# Patient Record
Sex: Female | Born: 1960 | Race: White | Hispanic: No | Smoking: Current every day smoker
Health system: Southern US, Community
[De-identification: ages and names within clinical notes are randomized; demographics above are authoritative.]

## PROBLEM LIST (undated history)

## (undated) DIAGNOSIS — F419 Anxiety disorder, unspecified: Secondary | ICD-10-CM

## (undated) DIAGNOSIS — J381 Polyp of vocal cord and larynx: Secondary | ICD-10-CM

## (undated) DIAGNOSIS — E785 Hyperlipidemia, unspecified: Secondary | ICD-10-CM

## (undated) HISTORY — DX: Polyp of vocal cord and larynx: J38.1

## (undated) HISTORY — DX: Anxiety disorder, unspecified: F41.9

## (undated) HISTORY — DX: Hyperlipidemia, unspecified: E78.5

## (undated) HISTORY — PX: TUBAL LIGATION: SHX77

---

## 2008-03-07 ENCOUNTER — Emergency Department (HOSPITAL_COMMUNITY): Admission: EM | Admit: 2008-03-07 | Discharge: 2008-03-08 | Payer: Self-pay | Admitting: Emergency Medicine

## 2008-03-11 ENCOUNTER — Emergency Department (HOSPITAL_COMMUNITY): Admission: EM | Admit: 2008-03-11 | Discharge: 2008-03-11 | Payer: Self-pay | Admitting: Emergency Medicine

## 2010-09-11 ENCOUNTER — Emergency Department (HOSPITAL_COMMUNITY): Admission: EM | Admit: 2010-09-11 | Discharge: 2010-09-11 | Payer: Self-pay | Admitting: Emergency Medicine

## 2011-03-13 LAB — URINALYSIS, ROUTINE W REFLEX MICROSCOPIC
Glucose, UA: NEGATIVE mg/dL
Nitrite: NEGATIVE
Urobilinogen, UA: 0.2 mg/dL (ref 0.0–1.0)
Urobilinogen, UA: 0.2 mg/dL (ref 0.0–1.0)
pH: 5 (ref 5.0–8.0)

## 2011-03-13 LAB — COMPREHENSIVE METABOLIC PANEL
ALT: 12 U/L (ref 0–35)
AST: 16 U/L (ref 0–37)
Alkaline Phosphatase: 45 U/L (ref 39–117)
BUN: 10 mg/dL (ref 6–23)
Calcium: 9.8 mg/dL (ref 8.4–10.5)
GFR calc non Af Amer: >60 mL/min (ref >60)
Potassium: 4 mEq/L (ref 3.5–5.1)
Total Bilirubin: 0.8 mg/dL (ref 0.3–1.2)
Total Protein: 7.4 g/dL (ref 6.0–8.3)

## 2011-03-13 LAB — DIFFERENTIAL
Eosinophils Relative: 1 % (ref 0–5)
Lymphocytes Relative: 35 % (ref 12–46)
Lymphs Abs: 2.9 10*3/uL (ref 0.7–4.0)
Neutrophils Relative %: 56 % (ref 43–77)

## 2011-03-13 LAB — LACTIC ACID, PLASMA: Lactic Acid, Venous: 1.2 mmol/L (ref 0.5–2.2)

## 2011-03-13 LAB — APTT: aPTT: 33 seconds (ref 24–37)

## 2011-03-13 LAB — CBC
HCT: 43.8 % (ref 36.0–46.0)
Hemoglobin: 15 g/dL (ref 12.0–15.0)
RDW: 14.4 % (ref 11.5–15.5)

## 2011-03-13 LAB — LIPASE, BLOOD: Lipase: 26 U/L (ref 11–59)

## 2011-03-13 LAB — PROTIME-INR
INR: 0.95 (ref 0.00–1.49)
Prothrombin Time: 12.9 seconds (ref 11.6–15.2)

## 2012-03-11 ENCOUNTER — Ambulatory Visit (INDEPENDENT_AMBULATORY_CARE_PROVIDER_SITE_OTHER): Payer: Self-pay | Admitting: Otolaryngology

## 2012-03-11 DIAGNOSIS — K219 Gastro-esophageal reflux disease without esophagitis: Secondary | ICD-10-CM

## 2012-03-11 DIAGNOSIS — J383 Other diseases of vocal cords: Secondary | ICD-10-CM

## 2012-03-11 DIAGNOSIS — R490 Dysphonia: Secondary | ICD-10-CM

## 2012-06-10 ENCOUNTER — Ambulatory Visit (INDEPENDENT_AMBULATORY_CARE_PROVIDER_SITE_OTHER): Payer: Self-pay | Admitting: Otolaryngology

## 2014-10-13 ENCOUNTER — Emergency Department (HOSPITAL_COMMUNITY)
Admission: EM | Admit: 2014-10-13 | Discharge: 2014-10-14 | Disposition: A | Discharge status: Home / Self Care | Payer: Self-pay | Attending: Emergency Medicine | Admitting: Emergency Medicine

## 2014-10-13 ENCOUNTER — Encounter (HOSPITAL_COMMUNITY): Payer: Self-pay | Admitting: Emergency Medicine

## 2014-10-13 DIAGNOSIS — S63501A Unspecified sprain of right wrist, initial encounter: Secondary | ICD-10-CM | POA: Insufficient documentation

## 2014-10-13 DIAGNOSIS — S0083XA Contusion of other part of head, initial encounter: Secondary | ICD-10-CM | POA: Insufficient documentation

## 2014-10-13 DIAGNOSIS — Z720 Tobacco use: Secondary | ICD-10-CM | POA: Insufficient documentation
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
## 2014-10-14 ENCOUNTER — Emergency Department (HOSPITAL_COMMUNITY): Payer: Self-pay

## 2014-10-14 MED ORDER — ACETAMINOPHEN 325 MG PO TABS
650.0000 mg | ORAL_TABLET | Freq: Once | ORAL | Status: AC
  Administered 2014-10-14: 650 mg via ORAL
  Filled 2014-10-14: qty 2

## 2014-10-14 MED ORDER — IBUPROFEN 800 MG PO TABS
800.0000 mg | ORAL_TABLET | Freq: Once | ORAL | Status: AC
  Administered 2014-10-14: 800 mg via ORAL
  Filled 2014-10-14: qty 1

## 2014-10-14 NOTE — Discharge Instructions (Signed)
Wear splint as needed. Take ibuprofen, acetaminophen, or naproxen as needed for pain.   Contusion A contusion is a deep bruise. Contusions are the result of an injury that caused bleeding under the skin. The contusion may turn blue, purple, or yellow. Minor injuries will give you a painless contusion, but more severe contusions may stay painful and swollen for a few weeks.  CAUSES  A contusion is usually caused by a blow, trauma, or direct force to an area of the body. SYMPTOMS   Swelling and redness of the injured area.  Bruising of the injured area.  Tenderness and soreness of the injured area.  Pain. DIAGNOSIS  The diagnosis can be made by taking a history and physical exam. An X-ray, CT scan, or MRI may be needed to determine if there were any associated injuries, such as fractures. TREATMENT  Specific treatment will depend on what area of the body was injured. In general, the best treatment for a contusion is resting, icing, elevating, and applying cold compresses to the injured area. Over-the-counter medicines may also be recommended for pain control. Ask your caregiver what the best treatment is for your contusion. HOME CARE INSTRUCTIONS   Put ice on the injured area.  Put ice in a plastic bag.  Place a towel between your skin and the bag.  Leave the ice on for 15-20 minutes, 3-4 times a day, or as directed by your health care provider.  Only take over-the-counter or prescription medicines for pain, discomfort, or fever as directed by your caregiver. Your caregiver may recommend avoiding anti-inflammatory medicines (aspirin, ibuprofen, and naproxen) for 48 hours because these medicines may increase bruising.  Rest the injured area.  If possible, elevate the injured area to reduce swelling. SEEK IMMEDIATE MEDICAL CARE IF:   You have increased bruising or swelling.  You have pain that is getting worse.  Your swelling or pain is not relieved with medicines. MAKE SURE  YOU:   Understand these instructions.  Will watch your condition.  Will get help right away if you are not doing well or get worse. Document Released: 09/24/2005 Document Revised: 12/20/2013 Document Reviewed: 10/20/2011 [facility] Patient Information 2015 [place], [place]. This information is not intended to replace advice given to you by your health care provider. Make sure you discuss any questions you have with your health care provider.  Wrist Pain Wrist injuries are frequent in adults and children. A sprain is an injury to the ligaments that hold your bones together. A strain is an injury to muscle or muscle cord-like structures (tendons) from stretching or pulling. Generally, when wrists are moderately tender to touch following a fall or injury, a break in the bone (fracture) may be present. Most wrist sprains or strains are better in 3 to 5 days, but complete healing may take several weeks. HOME CARE INSTRUCTIONS   Put ice on the injured area.  Put ice in a plastic bag.  Place a towel between your skin and the bag.  Leave the ice on for 15-20 minutes, 3-4 times a day, for the first 2 days, or as directed by your health care provider.  Keep your arm raised above the level of your heart whenever possible to reduce swelling and pain.  Rest the injured area for at least 48 hours or as directed by your health care provider.  If a splint or elastic bandage has been applied, use it for as long as directed by your health care provider or until seen by a  health care provider for a follow-up exam.  Only take over-the-counter or prescription medicines for pain, discomfort, or fever as directed by your health care provider.  Keep all follow-up appointments. You may need to follow up with a specialist or have follow-up X-rays. Improvement in pain level is not a guarantee that you did not fracture a bone in your wrist. The only way to determine whether or not you have a broken bone is by  X-ray. SEEK IMMEDIATE MEDICAL CARE IF:   Your fingers are swollen, very red, white, or cold and blue.  Your fingers are numb or tingling.  You have increasing pain.  You have difficulty moving your fingers. MAKE SURE YOU:   Understand these instructions.  Will watch your condition.  Will get help right away if you are not doing well or get worse. Document Released: 09/24/2005 Document Revised: 12/20/2013 Document Reviewed: 02/05/2011 [facility] Patient Information 2015 [place], [place]. This information is not intended to replace advice given to you by your health care provider. Make sure you discuss any questions you have with your health care provider.

## 2015-08-05 ENCOUNTER — Emergency Department (HOSPITAL_COMMUNITY)
Admission: EM | Admit: 2015-08-05 | Discharge: 2015-08-06 | Disposition: A | Discharge status: Home / Self Care | Payer: Self-pay | Attending: Emergency Medicine | Admitting: Emergency Medicine

## 2015-08-05 ENCOUNTER — Encounter (HOSPITAL_COMMUNITY): Payer: Self-pay | Admitting: Emergency Medicine

## 2015-08-05 ENCOUNTER — Emergency Department (HOSPITAL_COMMUNITY): Payer: Self-pay

## 2015-08-05 DIAGNOSIS — Z720 Tobacco use: Secondary | ICD-10-CM | POA: Insufficient documentation

## 2015-08-05 DIAGNOSIS — R410 Disorientation, unspecified: Secondary | ICD-10-CM | POA: Insufficient documentation

## 2015-08-05 DIAGNOSIS — E041 Nontoxic single thyroid nodule: Secondary | ICD-10-CM

## 2015-08-05 DIAGNOSIS — R202 Paresthesia of skin: Secondary | ICD-10-CM

## 2015-08-05 DIAGNOSIS — R200 Anesthesia of skin: Secondary | ICD-10-CM | POA: Insufficient documentation

## 2015-08-05 LAB — CBC WITH DIFFERENTIAL/PLATELET
Basophils Absolute: 0 10*3/uL (ref 0.0–0.1)
Basophils Relative: 0 % (ref 0–1)
Eosinophils Absolute: 0.1 10*3/uL (ref 0.0–0.7)
Eosinophils Relative: 1 % (ref 0–5)
HEMATOCRIT: 40.7 % (ref 36.0–46.0)
Hemoglobin: 13.7 g/dL (ref 12.0–15.0)
LYMPHS PCT: 39 % (ref 12–46)
Lymphs Abs: 2.8 10*3/uL (ref 0.7–4.0)
MCH: 34.9 pg — ABNORMAL HIGH (ref 26.0–34.0)
MCHC: 33.7 g/dL (ref 30.0–36.0)
MCV: 103.6 fL — AB (ref 78.0–100.0)
MONOS PCT: 7 % (ref 3–12)
Monocytes Absolute: 0.5 10*3/uL (ref 0.1–1.0)
NEUTROS ABS: 3.8 10*3/uL (ref 1.7–7.7)
NEUTROS PCT: 53 % (ref 43–77)
PLATELETS: 244 10*3/uL (ref 150–400)
RBC: 3.93 MIL/uL (ref 3.87–5.11)
RDW: 12.7 % (ref 11.5–15.5)
WBC: 7.3 10*3/uL (ref 4.0–10.5)

## 2015-08-05 LAB — COMPREHENSIVE METABOLIC PANEL
ALT: 13 U/L — AB (ref 14–54)
AST: 19 U/L (ref 15–41)
Albumin: 3.6 g/dL (ref 3.5–5.0)
Alkaline Phosphatase: 49 U/L (ref 38–126)
Anion gap: 6 (ref 5–15)
BILIRUBIN TOTAL: 0.3 mg/dL (ref 0.3–1.2)
BUN: 17 mg/dL (ref 6–20)
CO2: 26 mmol/L (ref 22–32)
Calcium: 9.2 mg/dL (ref 8.9–10.3)
Chloride: 109 mmol/L (ref 101–111)
Creatinine, Ser: 0.86 mg/dL (ref 0.44–1.00)
GFR calc Af Amer: >60 mL/min (ref >60)
GLUCOSE: 96 mg/dL (ref 65–99)
Potassium: 4.2 mmol/L (ref 3.5–5.1)
Sodium: 141 mmol/L (ref 135–145)
TOTAL PROTEIN: 6.3 g/dL — AB (ref 6.5–8.1)

## 2015-08-05 MED ORDER — IBUPROFEN 800 MG PO TABS
800.0000 mg | ORAL_TABLET | Freq: Once | ORAL | Status: AC
  Administered 2015-08-05: 800 mg via ORAL
  Filled 2015-08-05: qty 1

## 2015-08-05 NOTE — ED Notes (Signed)
Patient complaining of right arm pain for approximately 6 months. Reports pain from right armpit radiating down to right hand. Also reports numbness and and tingling through arm and to tips of fingers. States symptoms started after giving plasma. Patient also reports intermittent confusion.

## 2015-08-05 NOTE — ED Provider Notes (Signed)
CSN: [id number]     Arrival date & time 08/05/15  2012 History   First MD Initiated Contact with Patient 08/05/15 2306     Chief Complaint  Patient presents with  . Arm Pain     Patient is a 54 y.o. female presenting with arm pain. The history is provided by the patient.  Arm Pain This is a chronic problem. The current episode started more than 1 week ago. The problem occurs daily. The problem has been gradually worsening. Pertinent negatives include no chest pain and no shortness of breath. Nothing aggravates the symptoms. Nothing relieves the symptoms.  Patient reports chronic daily right arm pain for at least 3 months No trauma Reports pain started in right axilla and moves into entire right arm She reports arm feels "Asleep" at times and feels somewhat weak She also reports "confusion" for awhile reports it seems she can not always remember her words No other new weakness No neck/back pain No fever/vomiting No h/o CAD/CVA   PMH - none History  Substance Use Topics  . Smoking status: Current Every Day Smoker  . Smokeless tobacco: Not on file  . Alcohol Use: Yes   OB History    No data available     Review of Systems  Constitutional: Negative for fever.  Respiratory: Negative for shortness of breath.   Cardiovascular: Negative for chest pain.  Gastrointestinal: Negative for vomiting.  Musculoskeletal: Negative for back pain and neck pain.  Neurological: Positive for numbness.  All other systems reviewed and are negative.     Allergies  Review of patient's allergies indicates no known allergies.  Home Medications   Prior to Admission medications   Not on File   BP 124/83 mmHg  Pulse 71  Temp(Src) 98.6 F (37 C) (Oral)  Resp 20  Ht 5\' 4"  (1.626 m)  Wt 134 lb 4.8 oz (60.918 kg)  BMI 23.04 kg/m2  SpO2 98% Physical Exam CONSTITUTIONAL: Well developed/well nourished HEAD: Normocephalic/atraumatic EYES: EOMI/PERRL, no nystagmus, no ptosis ENMT: Mucous  membranes moist NECK: supple no meningeal signs, no bruits SPINE/BACK:entire spine nontender CV: S1/S2 noted, no murmurs/rubs/gallops noted Chest-  Tenderness along right lateral chest wall, no bruising/crepitus noted, no mass noted LUNGS: Lungs are clear to auscultation bilaterally, no apparent distress ABDOMEN: soft, nontender, no rebound or guarding GU:no cva tenderness NEURO:Awake/alert, facies symmetric, no arm or leg drift is noted Equal 5/5 strength with shoulder abduction, elbow flex/extension, wrist flex/extension in upper extremities  Equal 5/5 strength with hip flexion,knee flex/extension, foot dorsi/plantar flexion Cranial nerves 3/4/5/6/07/06/09/11/12 tested and intact Gait normal without ataxia No past pointing She reports "tingling" to right arm only  Equal (2+) biceps/brachioradialis/tricep reflex in bilateral UE EXTREMITIES: pulses normalx4, full ROM, no UE edema noted SKIN: warm, color normal PSYCH: no abnormalities of mood noted, alert and oriented to situation    ED Course  Procedures  11:58 PM Pt with daily arm pain/tingling for months No signs of acute stroke Denies neck pain, doubt cervical radiculopathy Imaging/labs ordered prior to my evaluation Will refer to neurology Will inform about thyroid nodule and need for [place]  Labs Review Labs Reviewed  COMPREHENSIVE METABOLIC PANEL - Abnormal; Notable for the following:    Total Protein 6.3 (*)    ALT 13 (*)    All other components within normal limits  CBC WITH DIFFERENTIAL/PLATELET - Abnormal; Notable for the following:    MCV 103.6 (*)    MCH 34.9 (*)    All other components within normal  limits    Imaging Review Ct Head Wo Contrast  08/05/2015   CLINICAL DATA:  Chronic right arm pain for 6 months, radiating to the right hand. Numbness and tingling at the right upper extremity. Intermittent confusion. Concern for cervical spine injury. Initial encounter.  EXAM: CT HEAD WITHOUT CONTRAST  CT CERVICAL SPINE  WITHOUT CONTRAST  TECHNIQUE: Multidetector CT imaging of the head and cervical spine was performed following the standard protocol without intravenous contrast. Multiplanar CT image reconstructions of the cervical spine were also generated.  COMPARISON:  None.  FINDINGS: CT HEAD FINDINGS  There is no evidence of acute infarction, mass lesion, or intra- or extra-axial hemorrhage on CT.  Calcification at the left caudate may reflect remote insult or a normal variant.  The posterior fossa, including the cerebellum, brainstem and fourth ventricle, is within normal limits. The third and lateral ventricles are unremarkable in appearance. The cerebral hemispheres are symmetric in appearance, with normal gray-white differentiation. No mass effect or midline shift is seen.  There is no evidence of fracture; visualized osseous structures are unremarkable in appearance. The visualized portions of the orbits are within normal limits. The paranasal sinuses and mastoid air cells are well-aerated. No significant soft tissue abnormalities are seen.  CT CERVICAL SPINE FINDINGS  There is no evidence of fracture or subluxation. Vertebral bodies demonstrate normal height and alignment. Multilevel disc space narrowing is noted along the mid to lower cervical spine, with scattered anterior and posterior disc osteophyte complexes, and mild underlying facet disease. Prevertebral soft tissues are within normal limits.  A 1.8 cm hypodensity is noted at the right thyroid lobe. The visualized lung apices are clear. Minimal calcification is noted at the carotid bifurcations bilaterally.  IMPRESSION: 1. No evidence of traumatic intracranial injury or fracture. 2. No evidence of fracture or subluxation along the cervical spine. 3. Mild degenerative change along the mid to lower cervical spine. 4. Minimal calcification at the carotid bifurcations bilaterally. 5. 1.8 cm hypodensity at the right thyroid lobe. Consider further evaluation with thyroid  ultrasound. If patient is clinically hyperthyroid, consider nuclear medicine thyroid uptake and scan.   Electronically Signed   By: Garald Balding M.D.   On: 08/05/2015 23:53   Ct Cervical Spine Wo Contrast  08/05/2015   CLINICAL DATA:  Chronic right arm pain for 6 months, radiating to the right hand. Numbness and tingling at the right upper extremity. Intermittent confusion. Concern for cervical spine injury. Initial encounter.  EXAM: CT HEAD WITHOUT CONTRAST  CT CERVICAL SPINE WITHOUT CONTRAST  TECHNIQUE: Multidetector CT imaging of the head and cervical spine was performed following the standard protocol without intravenous contrast. Multiplanar CT image reconstructions of the cervical spine were also generated.  COMPARISON:  None.  FINDINGS: CT HEAD FINDINGS  There is no evidence of acute infarction, mass lesion, or intra- or extra-axial hemorrhage on CT.  Calcification at the left caudate may reflect remote insult or a normal variant.  The posterior fossa, including the cerebellum, brainstem and fourth ventricle, is within normal limits. The third and lateral ventricles are unremarkable in appearance. The cerebral hemispheres are symmetric in appearance, with normal gray-white differentiation. No mass effect or midline shift is seen.  There is no evidence of fracture; visualized osseous structures are unremarkable in appearance. The visualized portions of the orbits are within normal limits. The paranasal sinuses and mastoid air cells are well-aerated. No significant soft tissue abnormalities are seen.  CT CERVICAL SPINE FINDINGS  There is no evidence of fracture  or subluxation. Vertebral bodies demonstrate normal height and alignment. Multilevel disc space narrowing is noted along the mid to lower cervical spine, with scattered anterior and posterior disc osteophyte complexes, and mild underlying facet disease. Prevertebral soft tissues are within normal limits.  A 1.8 cm hypodensity is noted at the right  thyroid lobe. The visualized lung apices are clear. Minimal calcification is noted at the carotid bifurcations bilaterally.  IMPRESSION: 1. No evidence of traumatic intracranial injury or fracture. 2. No evidence of fracture or subluxation along the cervical spine. 3. Mild degenerative change along the mid to lower cervical spine. 4. Minimal calcification at the carotid bifurcations bilaterally. 5. 1.8 cm hypodensity at the right thyroid lobe. Consider further evaluation with thyroid ultrasound. If patient is clinically hyperthyroid, consider nuclear medicine thyroid uptake and scan.   Electronically Signed   By: Garald Balding M.D.   On: 08/05/2015 23:53    Medications  ibuprofen (ADVIL,MOTRIN) tablet 800 mg (800 mg Oral Given 08/05/15 2328)    MDM   Final diagnoses:  Paresthesia    Nursing notes including past medical history and social history reviewed and considered in documentation Labs/vital reviewed myself and considered during evaluation     Ripley Fraise, MD 08/06/15 0004

## 2015-08-06 NOTE — Discharge Instructions (Signed)
PLEASE HAVE FOLLOWUP ULTRASOUND OF YOUR THYROID WITHIN ONE MONTH

## 2015-08-06 NOTE — ED Notes (Signed)
Discharge instructions given, pt demonstrated teach back and verbal understanding. No concerns voiced.  

## 2015-08-14 ENCOUNTER — Other Ambulatory Visit (HOSPITAL_COMMUNITY): Payer: Self-pay | Admitting: Emergency Medicine

## 2015-08-14 DIAGNOSIS — E041 Nontoxic single thyroid nodule: Secondary | ICD-10-CM

## 2015-08-14 DIAGNOSIS — R202 Paresthesia of skin: Secondary | ICD-10-CM

## 2015-08-23 ENCOUNTER — Ambulatory Visit (HOSPITAL_COMMUNITY): Payer: Self-pay

## 2015-08-29 ENCOUNTER — Ambulatory Visit (HOSPITAL_COMMUNITY)
Admission: RE | Admit: 2015-08-29 | Discharge: 2015-08-29 | Disposition: A | Discharge status: Home / Self Care | Payer: Self-pay | Source: Ambulatory Visit | Attending: Emergency Medicine | Admitting: Emergency Medicine

## 2015-08-29 DIAGNOSIS — E041 Nontoxic single thyroid nodule: Secondary | ICD-10-CM

## 2015-08-29 DIAGNOSIS — R202 Paresthesia of skin: Secondary | ICD-10-CM

## 2015-08-29 DIAGNOSIS — E010 Iodine-deficiency related diffuse (endemic) goiter: Secondary | ICD-10-CM | POA: Insufficient documentation

## 2015-08-29 DIAGNOSIS — E049 Nontoxic goiter, unspecified: Secondary | ICD-10-CM | POA: Insufficient documentation

## 2015-09-02 ENCOUNTER — Telehealth (HOSPITAL_COMMUNITY): Payer: Self-pay | Admitting: Emergency Medicine

## 2015-09-02 NOTE — ED Notes (Signed)
Outpatient ultrasound results found in my inbox. Pt noted to have thyroid nodule that was recommended to have biopsy It is unclear from documentation if patient was informed of this diagnosis I have sent a letter via [place] mail to patient to inform of her this diagnosis and also give referral to PCP She was also informed in letter that this could indicate cancer.    Ripley Fraise, MD 09/02/15 [phone]

## 2015-09-04 NOTE — ED Notes (Signed)
Attempted to contact patient x 4 today without success. Attempted to advise patient she must see a PCP TO GET [place].

## 2015-09-11 ENCOUNTER — Telehealth (HOSPITAL_COMMUNITY): Payer: Self-pay | Admitting: Emergency Medicine

## 2015-09-11 NOTE — ED Notes (Signed)
Phone call placed to patient See documentation  Ripley Fraise, MD 09/11/15 1052

## 2017-05-01 ENCOUNTER — Encounter (HOSPITAL_COMMUNITY): Payer: Self-pay | Admitting: *Deleted

## 2017-05-01 ENCOUNTER — Emergency Department (HOSPITAL_COMMUNITY)
Admission: EM | Admit: 2017-05-01 | Discharge: 2017-05-01 | Disposition: A | Discharge status: Home / Self Care | Payer: Self-pay | Attending: Emergency Medicine | Admitting: Emergency Medicine

## 2017-05-01 DIAGNOSIS — F17210 Nicotine dependence, cigarettes, uncomplicated: Secondary | ICD-10-CM | POA: Insufficient documentation

## 2017-05-01 DIAGNOSIS — K2900 Acute gastritis without bleeding: Secondary | ICD-10-CM | POA: Insufficient documentation

## 2017-05-01 DIAGNOSIS — R112 Nausea with vomiting, unspecified: Secondary | ICD-10-CM

## 2017-05-01 LAB — COMPREHENSIVE METABOLIC PANEL
ALT: 17 U/L (ref 14–54)
AST: 24 U/L (ref 15–41)
Albumin: 4.2 g/dL (ref 3.5–5.0)
Alkaline Phosphatase: 54 U/L (ref 38–126)
Anion gap: 10 (ref 5–15)
BUN: 18 mg/dL (ref 6–20)
CHLORIDE: 103 mmol/L (ref 101–111)
CO2: 23 mmol/L (ref 22–32)
Calcium: 9.2 mg/dL (ref 8.9–10.3)
Creatinine, Ser: 0.79 mg/dL (ref 0.44–1.00)
GFR calc Af Amer: >60 mL/min (ref >60)
Glucose, Bld: 64 mg/dL — ABNORMAL LOW (ref 65–99)
POTASSIUM: 3.6 mmol/L (ref 3.5–5.1)
SODIUM: 136 mmol/L (ref 135–145)
Total Bilirubin: 0.5 mg/dL (ref 0.3–1.2)
Total Protein: 7.2 g/dL (ref 6.5–8.1)

## 2017-05-01 LAB — LIPASE, BLOOD: LIPASE: 16 U/L (ref 11–51)

## 2017-05-01 LAB — CBC
HEMATOCRIT: 41.8 % (ref 36.0–46.0)
Hemoglobin: 14.6 g/dL (ref 12.0–15.0)
MCH: 35.1 pg — AB (ref 26.0–34.0)
MCHC: 34.9 g/dL (ref 30.0–36.0)
MCV: 100.5 fL — AB (ref 78.0–100.0)
Platelets: 290 10*3/uL (ref 150–400)
RBC: 4.16 MIL/uL (ref 3.87–5.11)
RDW: 13.5 % (ref 11.5–15.5)
WBC: 8 10*3/uL (ref 4.0–10.5)

## 2017-05-01 MED ORDER — PROMETHAZINE HCL 25 MG PO TABS: 25.0000 mg | ORAL_TABLET | Freq: Four times a day (QID) | ORAL | 1 refills | Status: DC | PRN

## 2017-05-01 MED ORDER — ONDANSETRON 4 MG PO TBDP
4.0000 mg | ORAL_TABLET | Freq: Once | ORAL | Status: AC
  Administered 2017-05-01: 4 mg via ORAL
  Filled 2017-05-01: qty 1

## 2017-05-01 NOTE — Discharge Instructions (Signed)
Take the Phenergan as needed for additional nausea and vomiting. Work note provided. Return for any new or worse symptoms. Most likely of the course of the vomiting illness has begun to resolve.

## 2017-05-01 NOTE — ED Provider Notes (Signed)
[place] DEPT Provider Note   CSN: [id number] Arrival date & time: 05/01/17  1256     History   Chief Complaint Chief Complaint  Patient presents with  . Emesis    HPI Denise Ford is a 56 y.o. female.  Acute onset of nausea and vomiting at 2:00 in the afternoon yesterday. Patient had just recently had lunch with hot dogs. Vomited 4-5 times yesterday and vomited 3 times this morning. However now she feels much better. No vomiting recently. No diarrhea no blood in the vomit. No other specific symptoms. No fever no significant abdominal pain. Actually now no abdominal pain.      History reviewed. No pertinent past medical history.  There are no active problems to display for this patient.   Past Surgical History:  Procedure Laterality Date  . CESAREAN SECTION      OB History    No data available       Home Medications    Prior to Admission medications   Medication Sig Start Date End Date Taking? Authorizing Provider  promethazine (PHENERGAN) 25 MG tablet Take 1 tablet (25 mg total) by mouth every 6 (six) hours as needed. 05/01/17   Fredia Sorrow, MD    Family History No family history on file.  Social History Social History  Substance Use Topics  . Smoking status: Current Every Day Smoker    Packs/day: 1.00    Types: Cigarettes  . Smokeless tobacco: Never Used  . Alcohol use Yes     Comment: occ.     Allergies   Patient has no known allergies.   Review of Systems Review of Systems  Constitutional: Negative for fever.  HENT: Negative for congestion.   Eyes: Negative for redness.  Respiratory: Negative for shortness of breath.   Cardiovascular: Negative for chest pain.  Gastrointestinal: Positive for nausea and vomiting. Negative for abdominal pain and diarrhea.  Genitourinary: Negative for dysuria.  Musculoskeletal: Negative for back pain and myalgias.  Skin: Negative for rash.  Neurological: Negative for headaches.  Hematological:  Does not bruise/bleed easily.  Psychiatric/Behavioral: Negative for confusion.     Physical Exam Updated Vital Signs BP (!) 124/57 (BP Location: Right Arm)   Pulse 86   Temp 98.6 F (37 C) (Oral)   Resp 18   Ht 5\' 4"  (1.626 m)   Wt 65.3 kg   SpO2 100%   BMI 24.72 kg/m   Physical Exam  Constitutional: She is oriented to person, place, and time. She appears well-developed and well-nourished.  HENT:  Head: Normocephalic and atraumatic.  Mouth/Throat: Oropharynx is clear and moist.  Eyes: EOM are normal. Pupils are equal, round, and reactive to light.  Neck: Normal range of motion. Neck supple.  Cardiovascular: Normal rate, regular rhythm and normal heart sounds.   Pulmonary/Chest: Effort normal and breath sounds normal.  Abdominal: Soft. Bowel sounds are normal. There is no tenderness.  Musculoskeletal: Normal range of motion. She exhibits no edema.  Neurological: She is oriented to person, place, and time. No cranial nerve deficit or sensory deficit. She exhibits normal muscle tone. Coordination normal.  Skin: Skin is warm.  Nursing note and vitals reviewed.    ED Treatments / Results  Labs (all labs ordered are listed, but only abnormal results are displayed) Labs Reviewed  COMPREHENSIVE METABOLIC PANEL - Abnormal; Notable for the following:       Result Value   Glucose, Bld 64 (*)    All other components within normal limits  CBC -  Abnormal; Notable for the following:    MCV 100.5 (*)    MCH 35.1 (*)    All other components within normal limits  LIPASE, BLOOD  URINALYSIS, ROUTINE W REFLEX MICROSCOPIC   Results for orders placed or performed during the hospital encounter of 05/01/17  Lipase, blood  Result Value Ref Range   Lipase 16 11 - 51 U/L  Comprehensive metabolic panel  Result Value Ref Range   Sodium 136 135 - 145 mmol/L   Potassium 3.6 3.5 - 5.1 mmol/L   Chloride 103 101 - 111 mmol/L   CO2 23 22 - 32 mmol/L   Glucose, Bld 64 (L) 65 - 99 mg/dL   BUN  18 6 - 20 mg/dL   Creatinine, Ser 0.79 0.44 - 1.00 mg/dL   Calcium 9.2 8.9 - 10.3 mg/dL   Total Protein 7.2 6.5 - 8.1 g/dL   Albumin 4.2 3.5 - 5.0 g/dL   AST 24 15 - 41 U/L   ALT 17 14 - 54 U/L   Alkaline Phosphatase 54 38 - 126 U/L   Total Bilirubin 0.5 0.3 - 1.2 mg/dL   GFR calc non Af Amer >60 >60 mL/min   GFR calc Af Amer >60 >60 mL/min   Anion gap 10 5 - 15  CBC  Result Value Ref Range   WBC 8.0 4.0 - 10.5 K/uL   RBC 4.16 3.87 - 5.11 MIL/uL   Hemoglobin 14.6 12.0 - 15.0 g/dL   HCT 41.8 36.0 - 46.0 %   MCV 100.5 (H) 78.0 - 100.0 fL   MCH 35.1 (H) 26.0 - 34.0 pg   MCHC 34.9 30.0 - 36.0 g/dL   RDW 13.5 11.5 - 15.5 %   Platelets 290 150 - 400 K/uL     EKG  EKG Interpretation None       Radiology No results found.  Procedures Procedures (including critical care time)  Medications Ordered in ED Medications  ondansetron (ZOFRAN-ODT) disintegrating tablet 4 mg (not administered)     Initial Impression / Assessment and Plan / ED Course  I have reviewed the triage vital signs and the nursing notes.  Pertinent labs & imaging results that were available during my care of the patient were reviewed by me and considered in my medical decision making (see chart for details).    Patient asymptomatic. Patient feels much better now. No abdominal pain. No vomiting for the last few hours. Consistent with an acute gastritis. Unlikely to be food poisoning since there was no diarrhea component. Patient very stable. Labs without any sniffing and malleus other than a very slight mild hypoglycemia.  Patient stable for discharge home.  Final Clinical Impressions(s) / ED Diagnoses   Final diagnoses:  Acute gastritis without hemorrhage, unspecified gastritis type  Non-intractable vomiting with nausea, unspecified vomiting type    New Prescriptions New Prescriptions   PROMETHAZINE (PHENERGAN) 25 MG TABLET    Take 1 tablet (25 mg total) by mouth every 6 (six) hours as needed.      Fredia Sorrow, MD 05/01/17 [phone]

## 2017-05-01 NOTE — ED Triage Notes (Signed)
Pt has vomiting that started last night after eating hot dogs for dinner. Pt denies any abdominal pain or diarrhea.

## 2017-05-01 NOTE — ED Triage Notes (Signed)
Rounding in [place], pt informed of current wait reason for delay- hope of DC and room placement in nextr 30  min

## 2017-05-01 NOTE — ED Triage Notes (Signed)
Pt reports N/V with 3 bouts of emesis today  She reports no physician, nor has she stopped any meds recently

## 2019-07-04 ENCOUNTER — Other Ambulatory Visit: Payer: Self-pay

## 2019-07-04 DIAGNOSIS — Z20822 Contact with and (suspected) exposure to covid-19: Secondary | ICD-10-CM

## 2019-07-09 LAB — NOVEL CORONAVIRUS, NAA: SARS-CoV-2, NAA: NOT DETECTED

## 2020-07-13 ENCOUNTER — Telehealth: Payer: Self-pay

## 2020-07-16 NOTE — Congregational Nurse Program (Signed)
  Dept: [phone]   Congregational Nurse Program Note  Date of Encounter: 07/16/2020  Past Medical History: No past medical history on file.  Encounter Details:  CNP Questionnaire - 07/16/20 1211      Questionnaire   Patient Status Not Applicable    Race White or Caucasian    Location Patient Served At [facility] Not Applicable    Uninsured Uninsured (NEW 1x/quarter)    Food Within past 12 months, worried food would run out with no money to buy more    Housing/Utilities Worried about losing housing    Transportation No transportation needs    Medical Provider No    Referrals Primary Care Provider/Clinic;[facility];Other   Referred to [employer] Program to assist with food, housing, behavioral health community resources   ED Visit Averted Not Applicable    Life-Saving Intervention Made Not Applicable

## 2020-07-16 NOTE — Congregational Nurse Program (Signed)
Patient attended [facility] on today to complete uninsured application for program enrollment with D. Luciana Axe (Eligibility/Program Manager) and pt was further advised by D.Luciana Axe hat she would be contacted with follow up phone call  to be provided with first visit medical appointment dates and time at [facility].   Patient was contacted by phone by myself  (W.Winefred Hillesheim, LPN) , to complete over the phone medical needs assessment and to  identify  chief medical concerns and to review [place] for any Medical/ social/behavioral resources needed.  Pt stated she wanted to establish care with a medical provider due to her concerns of her increased stress level and its side effects that she states maybe having on her health along with some recognized anxiety related symptoms seen and that has come about after learning about her husband recent cancer dx  Pt was Referred to Social Worker J. Otilio Carpen ([place]) to follow up on identified social determinants needs related to food resources and housing as it relates to not having enough income to maintain these resources.  Free Clinic Appointment was set and provided during visit with the date of Aug 17, 2020 @ [facility] first visit guidelines were reviewed with patient related to (arrival time, items to bring)

## 2020-08-07 ENCOUNTER — Ambulatory Visit: Payer: Self-pay | Admitting: Physician Assistant

## 2020-08-07 ENCOUNTER — Encounter: Payer: Self-pay | Admitting: Physician Assistant

## 2020-08-07 ENCOUNTER — Other Ambulatory Visit: Payer: Self-pay

## 2020-08-07 VITALS — BP 126/72 | HR 97 | Temp 99.7°F | Ht 63.0 in | Wt 161.0 lb

## 2020-08-07 DIAGNOSIS — E041 Nontoxic single thyroid nodule: Secondary | ICD-10-CM

## 2020-08-07 DIAGNOSIS — Z5971 Insufficient health insurance coverage: Secondary | ICD-10-CM

## 2020-08-07 DIAGNOSIS — Z13220 Encounter for screening for lipoid disorders: Secondary | ICD-10-CM

## 2020-08-07 DIAGNOSIS — Z1239 Encounter for other screening for malignant neoplasm of breast: Secondary | ICD-10-CM

## 2020-08-07 DIAGNOSIS — F17200 Nicotine dependence, unspecified, uncomplicated: Secondary | ICD-10-CM

## 2020-08-07 DIAGNOSIS — Z5989 Other problems related to housing and economic circumstances: Secondary | ICD-10-CM

## 2020-08-07 DIAGNOSIS — Z131 Encounter for screening for diabetes mellitus: Secondary | ICD-10-CM

## 2020-08-07 DIAGNOSIS — Z7689 Persons encountering health services in other specified circumstances: Secondary | ICD-10-CM

## 2020-08-07 DIAGNOSIS — F419 Anxiety disorder, unspecified: Secondary | ICD-10-CM

## 2020-08-07 MED ORDER — PAROXETINE HCL 20 MG PO TABS: 20.0000 mg | ORAL_TABLET | Freq: Every day | ORAL | 0 refills | Status: DC

## 2020-08-07 NOTE — Progress Notes (Signed)
BP 126/72   Pulse 97   Temp 99.7 F (37.6 C)   Ht 5\' 3"  (1.6 m)   Wt 161 lb (73 kg)   SpO2 96%   BMI 28.52 kg/m    Subjective:    Patient ID: Denise Ford, female    DOB: 1961-05-21, 59 y.o.   MRN: [id number]  HPI: Denise Ford is a 59 y.o. female presenting on 08/07/2020 for New Patient (Initial Visit) (last PCP was [facility] last seen there 2019.) and Anxiety (and stress. pt has been talking to counselor with [place]. pt's husband got diagnosed with squamous cell stage 4 cancer in March 2021.)   HPI   Pt had a negative covid 19 screening questionnaire.    Pt is a 41yoF who presents to establish care.  Pt very stressed about her husbands cancer- stage 4 lung cancer.  He was the breadwinner.  She is getting some help from [facility] and [facility].  He is getting chemo at [facility].  His chemo ends last week of August.    Pt has Never had a mammogram  She had thyroid US 2016 that recomended biopsy but she never had that done she says because she didn't have the money.  She got covid vaccination   She thinks her last PAP was 2016 at [facility].   She doesn't work.  She was doing some catering prior to covid.  She has anxiety.  She is just getting started with [facility].   She feels overwhelmed.  She says she is Not depressed.  She has No SI or HI.  She has No history of anxiety or depression.        Relevant past medical, surgical, family and social history reviewed and updated as indicated. Interim medical history since our last visit reviewed. Allergies and medications reviewed and updated.  CURRENT MEDS: Tylenol pm prn  Review of Systems  Per HPI unless specifically indicated above     Objective:    BP 126/72   Pulse 97   Temp 99.7 F (37.6 C)   Ht 5\' 3"  (1.6 m)   Wt 161 lb (73 kg)   SpO2 96%   BMI 28.52 kg/m   Wt Readings from Last 3 Encounters:  08/07/20 161 lb (73 kg)  05/01/17 144 lb (65.3 kg)  08/05/15 134 lb 4.8 oz (60.9 kg)    Physical  Exam Vitals reviewed.  Constitutional:      General: She is not in acute distress.    Appearance: Normal appearance. She is well-developed. She is not ill-appearing.  HENT:     Head: Normocephalic and atraumatic.  Eyes:     Conjunctiva/sclera: Conjunctivae normal.     Pupils: Pupils are equal, round, and reactive to light.  Neck:     Thyroid: No thyroid tenderness.  Cardiovascular:     Rate and Rhythm: Normal rate and regular rhythm.  Pulmonary:     Effort: Pulmonary effort is normal.     Breath sounds: Normal breath sounds.  Abdominal:     General: Bowel sounds are normal.     Palpations: Abdomen is soft. There is no mass.     Tenderness: There is no abdominal tenderness.  Musculoskeletal:     Cervical back: Neck supple.     Right lower leg: No edema.     Left lower leg: No edema.  Lymphadenopathy:     Cervical: No cervical adenopathy.  Skin:    General: Skin is warm and  dry.  Neurological:     Mental Status: She is alert and oriented to person, place, and time.     Gait: Gait normal.  Psychiatric:        Attention and Perception: Attention normal.        Mood and Affect: Affect is not inappropriate.        Speech: Speech normal.        Behavior: Behavior normal. Behavior is cooperative.          Assessment & Plan:    Encounter Diagnoses  Name Primary?  . Encounter to establish care Yes  . Anxiety   . Thyroid nodule   . Tobacco use disorder   . Encounter for screening for malignant neoplasm of breast, unspecified screening modality   . Screening cholesterol level   . Screening for diabetes mellitus   . Does not have health insurance      -will get baseline labs -pt will be started on paxil.  She is to continue with [facility].  Discussed with pt that she needs to let us know if they put her in the "control group" and do not provider her any counseling.  Discussed with pt that she needs support and counseling and she agrees. -will update thyroid  ultrasound.  Discussed with pt that she will likely still have nodule that will need FNA.  She states understanding -pt is given CAFA / application for cone financial assistance application -will refer for screening mammogram -discussed with pt that her PAP will be updated in the near future as well. -pt will follow up in 3 weeks to check mood and review labs.  Pt is to contact office sooner prn

## 2020-08-14 ENCOUNTER — Ambulatory Visit (HOSPITAL_COMMUNITY)
Admission: RE | Admit: 2020-08-14 | Discharge: 2020-08-14 | Disposition: A | Discharge status: Home / Self Care | Payer: Self-pay | Source: Ambulatory Visit | Attending: Physician Assistant | Admitting: Physician Assistant

## 2020-08-14 ENCOUNTER — Other Ambulatory Visit: Payer: Self-pay

## 2020-08-14 DIAGNOSIS — E041 Nontoxic single thyroid nodule: Secondary | ICD-10-CM | POA: Insufficient documentation

## 2020-08-15 ENCOUNTER — Other Ambulatory Visit: Payer: Self-pay | Admitting: Student

## 2020-08-15 DIAGNOSIS — Z1239 Encounter for other screening for malignant neoplasm of breast: Secondary | ICD-10-CM

## 2020-08-22 ENCOUNTER — Other Ambulatory Visit (HOSPITAL_COMMUNITY)
Admission: RE | Admit: 2020-08-22 | Discharge: 2020-08-22 | Disposition: A | Discharge status: Home / Self Care | Payer: Self-pay | Source: Ambulatory Visit | Attending: Physician Assistant | Admitting: Physician Assistant

## 2020-08-22 ENCOUNTER — Other Ambulatory Visit: Payer: Self-pay

## 2020-08-22 DIAGNOSIS — Z131 Encounter for screening for diabetes mellitus: Secondary | ICD-10-CM

## 2020-08-22 DIAGNOSIS — F419 Anxiety disorder, unspecified: Secondary | ICD-10-CM

## 2020-08-22 DIAGNOSIS — Z1239 Encounter for other screening for malignant neoplasm of breast: Secondary | ICD-10-CM

## 2020-08-22 DIAGNOSIS — E041 Nontoxic single thyroid nodule: Secondary | ICD-10-CM

## 2020-08-22 DIAGNOSIS — Z13220 Encounter for screening for lipoid disorders: Secondary | ICD-10-CM

## 2020-08-22 LAB — COMPREHENSIVE METABOLIC PANEL
ALT: 13 U/L (ref 0–44)
AST: 17 U/L (ref 15–41)
Albumin: 4.2 g/dL (ref 3.5–5.0)
Alkaline Phosphatase: 60 U/L (ref 38–126)
Anion gap: 7 (ref 5–15)
BUN: 14 mg/dL (ref 6–20)
CO2: 27 mmol/L (ref 22–32)
Calcium: 10 mg/dL (ref 8.9–10.3)
Chloride: 103 mmol/L (ref 98–111)
Creatinine, Ser: 0.69 mg/dL (ref 0.44–1.00)
GFR calc Af Amer: >60 mL/min (ref >60)
GFR calc non Af Amer: >60 mL/min (ref >60)
Glucose, Bld: 94 mg/dL (ref 70–99)
Potassium: 4.9 mmol/L (ref 3.5–5.1)
Sodium: 137 mmol/L (ref 135–145)
Total Bilirubin: 0.4 mg/dL (ref 0.3–1.2)
Total Protein: 7.4 g/dL (ref 6.5–8.1)

## 2020-08-22 LAB — LIPID PANEL
Cholesterol: 330 mg/dL — ABNORMAL HIGH (ref 0–200)
HDL: 67 mg/dL (ref >40)
LDL Cholesterol: 202 mg/dL — ABNORMAL HIGH (ref 0–99)
Total CHOL/HDL Ratio: 4.9 RATIO
Triglycerides: 305 mg/dL — ABNORMAL HIGH (ref <150)
VLDL: 61 mg/dL — ABNORMAL HIGH (ref 0–40)

## 2020-08-22 LAB — HEMOGLOBIN A1C
Hgb A1c MFr Bld: 5.4 % (ref 4.8–5.6)
Mean Plasma Glucose: 108.28 mg/dL

## 2020-08-22 LAB — T4, FREE: Free T4: 0.77 ng/dL (ref 0.61–1.12)

## 2020-08-22 LAB — TSH: TSH: 1.836 u[IU]/mL (ref 0.350–4.500)

## 2020-08-28 ENCOUNTER — Encounter: Payer: Self-pay | Admitting: Physician Assistant

## 2020-08-28 ENCOUNTER — Ambulatory Visit: Payer: Self-pay | Admitting: Physician Assistant

## 2020-08-28 DIAGNOSIS — E785 Hyperlipidemia, unspecified: Secondary | ICD-10-CM

## 2020-08-28 DIAGNOSIS — Z5989 Other problems related to housing and economic circumstances: Secondary | ICD-10-CM

## 2020-08-28 DIAGNOSIS — E041 Nontoxic single thyroid nodule: Secondary | ICD-10-CM

## 2020-08-28 DIAGNOSIS — Z5971 Insufficient health insurance coverage: Secondary | ICD-10-CM

## 2020-08-28 DIAGNOSIS — F17200 Nicotine dependence, unspecified, uncomplicated: Secondary | ICD-10-CM

## 2020-08-28 DIAGNOSIS — F419 Anxiety disorder, unspecified: Secondary | ICD-10-CM

## 2020-08-28 MED ORDER — ATORVASTATIN CALCIUM 20 MG PO TABS
20.0000 mg | ORAL_TABLET | Freq: Every day | ORAL | 1 refills | Status: DC
Start: 2020-08-28 — End: 2021-02-07

## 2020-08-28 NOTE — Progress Notes (Signed)
There were no vitals taken for this visit.   Subjective:    Patient ID: Denise Ford, female    DOB: [birth year], 59 y.o.   MRN: [id number]  HPI: Denise Ford is a 59 y.o. female presenting on 08/28/2020 for No chief complaint on file.   HPI   This is a telemedicine appointment due to coronavirus pandemic.  It is via telephone as pt does not have video enabled device.  I connected with  Denise Ford on 08/28/2020 by a video enabled telemedicine application and verified that I am speaking with the correct person using two identifiers.   I discussed the limitations of evaluation and management by telemedicine. The patient expressed understanding and agreed to proceed.  Pt is at home.  Provider is at office.    Pt is 35yoF who established care earlier this month.  She was started on paxil.  She says she doesn't get as upset as easily but doesn't feel any different.   She then realizes she is doing better because she doesn't get upset as easilty.  She is approved for CAFA/[employer] financial assistance  Pt has appt with [facility] tomorrow for counseling  She has appt for mammogram      Relevant past medical, surgical, family and social history reviewed and updated as indicated. Interim medical history since our last visit reviewed. Allergies and medications reviewed and updated.   Current Outpatient Medications:    diphenhydramine-acetaminophen (TYLENOL PM) 25-500 MG TABS tablet, Take 1 tablet by mouth at bedtime as needed., Disp: , Rfl:    PARoxetine (PAXIL) 20 MG tablet, Take 1 tablet (20 mg total) by mouth daily., Disp: 30 tablet, Rfl: 0    Review of Systems  Per HPI unless specifically indicated above     Objective:    There were no vitals taken for this visit.  Wt Readings from Last 3 Encounters:  08/07/20 161 lb (73 kg)  05/01/17 144 lb (65.3 kg)  08/05/15 134 lb 4.8 oz (60.9 kg)    Physical Exam Pulmonary:     Effort: No respiratory distress.   Neurological:     Mental Status: She is alert and oriented to person, place, and time.  Psychiatric:        Attention and Perception: Attention normal.        Speech: Speech normal.        Behavior: Behavior is cooperative.     Results for orders placed or performed during the hospital encounter of 08/22/20  Lipid panel  Result Value Ref Range   Cholesterol 330 (H) 0 - 200 mg/dL   Triglycerides 305 (H) <150 mg/dL   HDL 67 >40 mg/dL   Total CHOL/HDL Ratio 4.9 RATIO   VLDL 61 (H) 0 - 40 mg/dL   LDL Cholesterol 202 (H) 0 - 99 mg/dL  TSH  Result Value Ref Range   TSH 1.836 0.350 - 4.500 uIU/mL  Hemoglobin A1c  Result Value Ref Range   Hgb A1c MFr Bld 5.4 4.8 - 5.6 %   Mean Plasma Glucose 108.28 mg/dL  Comprehensive metabolic panel  Result Value Ref Range   Sodium 137 135 - 145 mmol/L   Potassium 4.9 3.5 - 5.1 mmol/L   Chloride 103 98 - 111 mmol/L   CO2 27 22 - 32 mmol/L   Glucose, Bld 94 70 - 99 mg/dL   BUN 14 6 - 20 mg/dL   Creatinine, Ser 0.69 0.44 - 1.00 mg/dL   Calcium 10.0 8.9 -  10.3 mg/dL   Total Protein 7.4 6.5 - 8.1 g/dL   Albumin 4.2 3.5 - 5.0 g/dL   AST 17 15 - 41 U/L   ALT 13 0 - 44 U/L   Alkaline Phosphatase 60 38 - 126 U/L   Total Bilirubin 0.4 0.3 - 1.2 mg/dL   GFR calc non Af Amer >60 >60 mL/min   GFR calc Af Amer >60 >60 mL/min   Anion gap 7 5 - 15  T4, free  Result Value Ref Range   Free T4 0.77 0.61 - 1.12 ng/dL      Assessment & Plan:   Encounter Diagnoses  Name Primary?   Thyroid nodule Yes   Tobacco use disorder    Anxiety    Hyperlipidemia, unspecified hyperlipidemia type    Does not have health insurance     -reviewed labs with pt -Start atorvastatin.  Counseled on lowfat diet  -Refer to endocrine for nodule that needs bx/FNA -continue paxil.  Keep appointment with counseling -pt to follow up 6 weeks to recheck mood.  She is contact office sooner prn

## 2020-08-29 DIAGNOSIS — F17210 Nicotine dependence, cigarettes, uncomplicated: Secondary | ICD-10-CM | POA: Insufficient documentation

## 2020-08-29 DIAGNOSIS — E785 Hyperlipidemia, unspecified: Secondary | ICD-10-CM | POA: Insufficient documentation

## 2020-08-29 DIAGNOSIS — E041 Nontoxic single thyroid nodule: Secondary | ICD-10-CM | POA: Insufficient documentation

## 2020-08-29 DIAGNOSIS — F17200 Nicotine dependence, unspecified, uncomplicated: Secondary | ICD-10-CM | POA: Insufficient documentation

## 2020-09-05 ENCOUNTER — Ambulatory Visit (HOSPITAL_COMMUNITY): Payer: Self-pay

## 2020-09-05 ENCOUNTER — Other Ambulatory Visit: Payer: Self-pay

## 2020-09-05 ENCOUNTER — Ambulatory Visit (HOSPITAL_COMMUNITY)
Admission: RE | Admit: 2020-09-05 | Discharge: 2020-09-05 | Disposition: A | Discharge status: Home / Self Care | Payer: Self-pay | Source: Ambulatory Visit | Attending: Physician Assistant | Admitting: Physician Assistant

## 2020-09-05 DIAGNOSIS — Z1239 Encounter for other screening for malignant neoplasm of breast: Secondary | ICD-10-CM | POA: Insufficient documentation

## 2020-09-07 ENCOUNTER — Other Ambulatory Visit (HOSPITAL_COMMUNITY): Payer: Self-pay | Admitting: Physician Assistant

## 2020-09-07 DIAGNOSIS — R928 Other abnormal and inconclusive findings on diagnostic imaging of breast: Secondary | ICD-10-CM

## 2020-09-18 ENCOUNTER — Ambulatory Visit (HOSPITAL_COMMUNITY)
Admission: RE | Admit: 2020-09-18 | Discharge: 2020-09-18 | Disposition: A | Discharge status: Home / Self Care | Payer: Self-pay | Source: Ambulatory Visit | Attending: Physician Assistant | Admitting: Physician Assistant

## 2020-09-18 ENCOUNTER — Other Ambulatory Visit: Payer: Self-pay

## 2020-09-18 DIAGNOSIS — R928 Other abnormal and inconclusive findings on diagnostic imaging of breast: Secondary | ICD-10-CM

## 2020-09-28 ENCOUNTER — Other Ambulatory Visit: Payer: Self-pay

## 2020-09-28 ENCOUNTER — Encounter: Payer: Self-pay | Admitting: "Endocrinology

## 2020-09-28 ENCOUNTER — Ambulatory Visit (INDEPENDENT_AMBULATORY_CARE_PROVIDER_SITE_OTHER): Payer: Self-pay | Admitting: "Endocrinology

## 2020-09-28 VITALS — BP 111/77 | HR 84 | Resp 16 | Ht 64.0 in | Wt 165.8 lb

## 2020-09-28 DIAGNOSIS — F17200 Nicotine dependence, unspecified, uncomplicated: Secondary | ICD-10-CM

## 2020-09-28 DIAGNOSIS — E049 Nontoxic goiter, unspecified: Secondary | ICD-10-CM | POA: Insufficient documentation

## 2020-09-28 NOTE — Progress Notes (Signed)
Endocrinology Consult Note                                            09/28/2020, 9:46 AM   Subjective:    Patient ID: Denise Ford, female    DOB: 1961/02/25, PCP Soyla Dryer, PA-C   Past Medical History:  Diagnosis Date  . Anxiety   . Hyperlipidemia    Past Surgical History:  Procedure Laterality Date  . CESAREAN SECTION     x2  . TUBAL LIGATION     Social History   Socioeconomic History  . Marital status: Legally Separated    Spouse name: Not on file  . Number of children: Not on file  . Years of education: Not on file  . Highest education level: Not on file  Occupational History  . Not on file  Tobacco Use  . Smoking status: Current Every Day Smoker    Packs/day: 1.00    Years: 30.00    Pack years: 30.00    Types: Cigarettes  . Smokeless tobacco: Never Used  Vaping Use  . Vaping Use: Never used  Substance and Sexual Activity  . Alcohol use: Yes    Comment: occ.  . Drug use: No  . Sexual activity: Not on file  Other Topics Concern  . Not on file  Social History Narrative  . Not on file   Social Determinants of Health   Financial Resource Strain:   . Difficulty of Paying Living Expenses: Not on file  Food Insecurity:   . Worried About Charity fundraiser in the Last Year: Not on file  . Ran Out of Food in the Last Year: Not on file  Transportation Needs:   . Lack of Transportation (Medical): Not on file  . Lack of Transportation (Non-Medical): Not on file  Physical Activity:   . Days of Exercise per Week: Not on file  . Minutes of Exercise per Session: Not on file  Stress:   . Feeling of Stress : Not on file  Social Connections:   . Frequency of Communication with Friends and Family: Not on file  . Frequency of Social Gatherings with Friends and Family: Not on file  . Attends Religious Services: Not on file  . Active Member of Clubs or Organizations: Not on file  . Attends Archivist Meetings: Not on file  . Marital  Status: Not on file   Family History  Problem Relation Age of Onset  . Hypertension Mother   . COPD Mother   . Cancer Mother        thoat cancer  . Hypertension Father   . Cancer Paternal Aunt        pancreatic cancer  . Cancer Paternal Uncle    Outpatient Encounter Medications as of 09/28/2020  Medication Sig  . atorvastatin (LIPITOR) 20 MG tablet Take 1 tablet (20 mg total) by mouth daily.  . diphenhydramine-acetaminophen (TYLENOL PM) 25-500 MG TABS tablet Take 1 tablet by mouth at bedtime as needed.  Marland Kitchen PARoxetine (PAXIL) 20 MG tablet Take 1 tablet (20 mg total) by mouth daily.   No facility-administered encounter medications on file as of 09/28/2020.   ALLERGIES: No Known Allergies  VACCINATION STATUS: Immunization History  Administered Date(s) Administered  . Moderna SARS-COVID-2 Vaccination 05/04/2020, 06/01/2020    HPI [name] is 59  y.o. female who presents today with a medical history as above. she is being seen in consultation for nodular goiter requested by Soyla Dryer, PA-C.  History is obtained directly from the patient.  She is known to have nodular goiter since 2016.  She is not on any thyroid hormone supplements or antithyroid medications.  Her most recent thyroid ultrasound on August 14, 2020 showed slight increase in the size of a nodule on the right lobe of her thyroid to 2.6 cm.  She denies dysphagia, shortness of breath.  She is a chronic heavy smoker, reports some voice change recently.  Her voice became more hoarse lately.  -She denies weight loss, palpitations, heat/cold intolerance.  She has some unidentified thyroid dysfunction in one of her grandparents.     Review of Systems  Constitutional: + Minimally fluctuating body weight, + fatigue , no subjective hyperthermia, no subjective hypothermia Eyes: no blurry vision, no xerophthalmia ENT: no sore throat, no nodules palpated in throat, no dysphagia/odynophagia, + hoarseness Cardiovascular:  no Chest Pain, no Shortness of Breath, no palpitations, no leg swelling Respiratory: no cough, no shortness of breath Gastrointestinal: no Nausea/Vomiting/Diarhhea Musculoskeletal: no muscle/joint aches Skin: no rashes Neurological: no tremors, no numbness, no tingling, no dizziness Psychiatric: no depression, no anxiety  Objective:    Vitals with BMI 09/28/2020 08/07/2020 05/01/2017  Height 5\' 4"  5\' 3"  -  Weight 165 lbs 13 oz 161 lbs -  BMI 22.02 54.27 -  Systolic 062 376 283  Diastolic 77 72 57  Pulse 84 97 86    BP 111/77   Pulse 84   Resp 16   Ht 5\' 4"  (1.626 m)   Wt 165 lb 12.8 oz (75.2 kg)   SpO2 98%   BMI 28.46 kg/m   Wt Readings from Last 3 Encounters:  09/28/20 165 lb 12.8 oz (75.2 kg)  08/07/20 161 lb (73 kg)  05/01/17 144 lb (65.3 kg)    Physical Exam  Constitutional:  Body mass index is 28.46 kg/m.,  not in acute distress, normal state of mind Eyes: PERRLA, EOMI, no exophthalmos ENT: moist mucous membranes, + gross thyromegaly, no gross cervical lymphadenopathy Cardiovascular: normal precordial activity, Regular Rate and Rhythm, no Murmur/Rubs/Gallops Respiratory:  adequate breathing efforts, no gross chest deformity, Clear to auscultation bilaterally Gastrointestinal: abdomen soft, Non -tender, No distension, Bowel Sounds present, no gross organomegaly Musculoskeletal: no gross deformities, strength intact in all four extremities Skin: moist, warm, no rashes Neurological: no tremor with outstretched hands, Deep tendon reflexes normal in bilateral lower extremities.  CMP ( most recent) CMP     Component Value Date/Time   NA 137 08/22/2020 0949   K 4.9 08/22/2020 0949   CL 103 08/22/2020 0949   CO2 27 08/22/2020 0949   GLUCOSE 94 08/22/2020 0949   BUN 14 08/22/2020 0949   CREATININE 0.69 08/22/2020 0949   CALCIUM 10.0 08/22/2020 0949   PROT 7.4 08/22/2020 0949   ALBUMIN 4.2 08/22/2020 0949   AST 17 08/22/2020 0949   ALT 13 08/22/2020 0949   ALKPHOS  60 08/22/2020 0949   BILITOT 0.4 08/22/2020 0949   GFRNONAA >60 08/22/2020 0949   GFRAA >60 08/22/2020 0949     Diabetic Labs (most recent): Lab Results  Component Value Date   HGBA1C 5.4 08/22/2020     Lipid Panel ( most recent) Lipid Panel     Component Value Date/Time   CHOL 330 (H) 08/22/2020 0948   TRIG 305 (H) 08/22/2020 0948   HDL 67 08/22/2020  0948   CHOLHDL 4.9 08/22/2020 0948   VLDL 61 (H) 08/22/2020 0948   LDLCALC 202 (H) 08/22/2020 0948      Lab Results  Component Value Date   TSH 1.836 08/22/2020   FREET4 0.77 08/22/2020     Thyroid ultrasound on August 14, 2020: Right lobe measured 5.4 cm with 2.6 cm nodule which increased in size from 2.2 cm in 2016 Left lobe measured 4.3 cm with no discrete nodules.   Assessment & Plan:   1. Nodular goiter 2. Current smoker - [facility]  is being seen at a kind request of Soyla Dryer, PA-C. - I have reviewed her available thyroid records and clinically evaluated the patient. - Based on these reviews, she has euthyroid nodular goiter. -In light of suspicious nodule, smoking status, and identified thyroid dysfunction in her family, she is approached for fine-needle aspiration of the right lobe nodule.  She agrees with plan. -This procedure will be scheduled to be done in [facility].  Patient will return with her biopsy results for treatment decision if necessary.  The patient was counseled on the dangers of tobacco use, and was advised to quit.  Reviewed strategies to maximize success, including removing cigarettes and smoking materials from environment.   - I did not initiate any new prescriptions today. - she is advised to maintain close follow up with Soyla Dryer, PA-C for primary care needs.   - Time spent with the patient: 45 minutes, of which >50% was spent in  counseling her about her nodular goiter, smoking and the rest in obtaining information about her symptoms, reviewing her  previous labs/studies ( including abstractions from other facilities),  evaluations, and treatments,  and developing a plan to confirm diagnosis and long term treatment based on the latest standards of care/guidelines; and documenting her care.  Denise Ford participated in the discussions, expressed understanding, and voiced agreement with the above plans.  All questions were answered to her satisfaction. she is encouraged to contact clinic should she have any questions or concerns prior to her return visit.  Follow up plan: Return in about 3 weeks (around 10/19/2020) for F/U with Biopsy Results.   Glade Lloyd, MD [facility] [address] [place], [place] [address] Phone: [phone]  Fax: [phone]     09/28/2020, 9:46 AM  This note was partially dictated with voice recognition software. Similar sounding words can be transcribed inadequately or may not  be corrected upon review.

## 2020-10-02 ENCOUNTER — Encounter (HOSPITAL_COMMUNITY): Payer: Self-pay

## 2020-10-03 ENCOUNTER — Encounter: Payer: Self-pay | Admitting: Physician Assistant

## 2020-10-03 ENCOUNTER — Ambulatory Visit: Payer: Self-pay | Admitting: Physician Assistant

## 2020-10-03 ENCOUNTER — Other Ambulatory Visit: Payer: Self-pay

## 2020-10-03 VITALS — BP 96/60 | HR 79 | Temp 98.1°F | Ht 63.25 in | Wt 166.0 lb

## 2020-10-03 DIAGNOSIS — F17200 Nicotine dependence, unspecified, uncomplicated: Secondary | ICD-10-CM

## 2020-10-03 DIAGNOSIS — E041 Nontoxic single thyroid nodule: Secondary | ICD-10-CM

## 2020-10-03 DIAGNOSIS — E785 Hyperlipidemia, unspecified: Secondary | ICD-10-CM

## 2020-10-03 DIAGNOSIS — F419 Anxiety disorder, unspecified: Secondary | ICD-10-CM

## 2020-10-03 DIAGNOSIS — Z636 Dependent relative needing care at home: Secondary | ICD-10-CM

## 2020-10-03 MED ORDER — PAROXETINE HCL 40 MG PO TABS
40.0000 mg | ORAL_TABLET | ORAL | 0 refills | Status: DC
Start: 2020-10-03 — End: 2021-03-18

## 2020-10-03 NOTE — Progress Notes (Signed)
   BP 96/60   Pulse 79   Temp 98.1 F (36.7 C)   SpO2 96%    Subjective:    Patient ID: Denise Ford, female    DOB: 03/02/1961, 59 y.o.   MRN: [id number]  HPI: Denise Ford is a 59 y.o. female presenting on 10/03/2020 for No chief complaint on file.   HPI    Is [facility]- she doesn't know whether she will be getting therapy or not.  She had to r/s with them and didn't get seen until last week.  Mood is still "a little iffy".   Not tearful.  She is mostly still worried.  About her husband.  And finances. And thyroid bx.  She says she talked with [facility].         Relevant past medical, surgical, family and social history reviewed and updated as indicated. Interim medical history since our last visit reviewed. Allergies and medications reviewed and updated.   Current Outpatient Medications:  .  atorvastatin (LIPITOR) 20 MG tablet, Take 1 tablet (20 mg total) by mouth daily., Disp: 90 tablet, Rfl: 1 .  diphenhydramine-acetaminophen (TYLENOL PM) 25-500 MG TABS tablet, Take 1 tablet by mouth at bedtime as needed., Disp: , Rfl:  .  PARoxetine (PAXIL) 20 MG tablet, Take 1 tablet (20 mg total) by mouth daily., Disp: 30 tablet, Rfl: 0\\  Review of Systems  Per HPI unless specifically indicated above     Objective:    BP 96/60   Pulse 79   Temp 98.1 F (36.7 C)   SpO2 96%   Wt Readings from Last 3 Encounters:  09/28/20 165 lb 12.8 oz (75.2 kg)  08/07/20 161 lb (73 kg)  05/01/17 144 lb (65.3 kg)    Physical Exam Constitutional:      General: She is not in acute distress. HENT:     Head: Normocephalic and atraumatic.  Pulmonary:     Effort: Pulmonary effort is normal. No respiratory distress.  Neurological:     Mental Status: She is alert and oriented to person, place, and time.     Gait: Gait is intact.  Psychiatric:        Attention and Perception: Attention normal.        Mood and Affect: Affect is not inappropriate.        Speech:  Speech normal.        Behavior: Behavior normal. Behavior is cooperative.     Comments: Converses appropriately.            Assessment & Plan:   Encounter Diagnoses  Name Primary?  Marland Kitchen Anxiety Yes  . Caregiver burden   . Thyroid nodule   . Tobacco use disorder   . Hyperlipidemia, unspecified hyperlipidemia type       -Increase paxil. -Pt is encouraged to contact Duanne Limerick to inquire about support group and other assistance -pt to continue with [facility].  She is again reminded to let us know if she gets therapy or not from there -pt is counseled on smoking cessation -pt to follow up 6 weeks.  She is to contact office sooner prn

## 2020-10-10 ENCOUNTER — Encounter (HOSPITAL_COMMUNITY): Payer: Self-pay

## 2020-10-10 ENCOUNTER — Ambulatory Visit (HOSPITAL_COMMUNITY)
Admission: RE | Admit: 2020-10-10 | Discharge: 2020-10-10 | Disposition: A | Discharge status: Home / Self Care | Payer: Self-pay | Source: Ambulatory Visit | Attending: "Endocrinology | Admitting: "Endocrinology

## 2020-10-10 ENCOUNTER — Other Ambulatory Visit: Payer: Self-pay

## 2020-10-10 DIAGNOSIS — E049 Nontoxic goiter, unspecified: Secondary | ICD-10-CM

## 2020-10-10 DIAGNOSIS — E041 Nontoxic single thyroid nodule: Secondary | ICD-10-CM | POA: Insufficient documentation

## 2020-10-10 MED ORDER — LIDOCAINE HCL (PF) 2 % IJ SOLN
INTRAMUSCULAR | Status: AC
  Administered 2020-10-10: 10 mL
  Filled 2020-10-10: qty 10

## 2020-10-10 NOTE — Procedures (Signed)
PROCEDURE SUMMARY:  Using direct ultrasound guidance, 5 passes were made using 25 g needles into the nodule within the right lobe of the thyroid.   Ultrasound was used to confirm needle placements on all occasions.   EBL = trace  Specimens were sent to Pathology for analysis.  See procedure note under Imaging tab in Epic for full procedure details.  Linkyn Gobin S Icyss Skog PA-C 10/10/2020 9:52 AM

## 2020-10-10 NOTE — Sedation Documentation (Signed)
PT tolerated thyroid biopsy procedure well today. Labs obtained and sent for pathology. PT ambulatory at discharge with no acute distress noted, given ice pack and verbalized understanding of discharge instructions.

## 2020-10-10 NOTE — Discharge Instructions (Signed)
Thyroid Needle Biopsy, Care After This sheet gives you information about how to care for yourself after your procedure. Your health care provider may also give you more specific instructions. If you have problems or questions, contact your health care provider. What can I expect after the procedure? After the procedure, it is common to have:  Soreness and tenderness that lasts for a few days.  Bruising where the needle was inserted (puncture site). Follow these instructions at home:   Take over-the-counter and prescription medicines only as told by your health care provider.  To help ease discomfort, keep your head raised (elevated) when you are lying down. When you move from lying down to sitting up, use both hands to support the back of your head and neck.  Check your puncture site every day for signs of infection. Check for: ? Redness, swelling, or pain. ? Fluid or blood. ? Warmth. ? Pus or a bad smell.  Return to your normal activities as told by your health care provider. Ask your health care provider what activities are safe for you.  Keep all follow-up visits as told by your health care provider. This is important. Contact a health care provider if:  You have redness, swelling, or pain around your puncture site.  You have fluid or blood coming from your puncture site.  Your puncture site feels warm to the touch.  You have pus or a bad smell coming from your puncture site.  You have a fever. Get help right away if:  You have severe bleeding from the puncture site.  You have difficulty swallowing.  You have swollen glands (lymph nodes) in your neck. Summary  It is common to have some bruising and soreness where the needle was inserted in your lower front neck area (puncture site).  Check your puncture site every day for signs of infection, such as redness, swelling, or pain.  Get help right away if you have severe bleeding from your puncture site. This  information is not intended to replace advice given to you by your health care provider. Make sure you discuss any questions you have with your health care provider. Document Revised: 11/27/2017 Document Reviewed: 09/28/2017 Elsevier Patient Education  2020 Elsevier Inc.  

## 2020-10-11 LAB — CYTOLOGY - NON PAP

## 2020-10-19 ENCOUNTER — Ambulatory Visit (INDEPENDENT_AMBULATORY_CARE_PROVIDER_SITE_OTHER): Payer: Self-pay | Admitting: "Endocrinology

## 2020-10-19 ENCOUNTER — Other Ambulatory Visit: Payer: Self-pay

## 2020-10-19 ENCOUNTER — Encounter: Payer: Self-pay | Admitting: "Endocrinology

## 2020-10-19 VITALS — BP 100/68 | HR 72 | Ht 63.25 in | Wt 164.0 lb

## 2020-10-19 DIAGNOSIS — E049 Nontoxic goiter, unspecified: Secondary | ICD-10-CM

## 2020-10-19 NOTE — Progress Notes (Signed)
10/19/2020, 10:22 AM  Endocrinology follow-up note   Subjective:    Patient ID: Denise Ford, female    DOB: 08/07/1961, PCP Soyla Dryer, PA-C   Past Medical History:  Diagnosis Date  . Anxiety   . Hyperlipidemia    Past Surgical History:  Procedure Laterality Date  . CESAREAN SECTION     x2  . TUBAL LIGATION     Social History   Socioeconomic History  . Marital status: Legally Separated    Spouse name: Not on file  . Number of children: Not on file  . Years of education: Not on file  . Highest education level: Not on file  Occupational History  . Not on file  Tobacco Use  . Smoking status: Current Every Day Smoker    Packs/day: 1.00    Years: 30.00    Pack years: 30.00    Types: Cigarettes  . Smokeless tobacco: Never Used  Vaping Use  . Vaping Use: Never used  Substance and Sexual Activity  . Alcohol use: Yes    Comment: occ.  . Drug use: No  . Sexual activity: Not on file  Other Topics Concern  . Not on file  Social History Narrative  . Not on file   Social Determinants of Health   Financial Resource Strain:   . Difficulty of Paying Living Expenses: Not on file  Food Insecurity:   . Worried About Charity fundraiser in the Last Year: Not on file  . Ran Out of Food in the Last Year: Not on file  Transportation Needs:   . Lack of Transportation (Medical): Not on file  . Lack of Transportation (Non-Medical): Not on file  Physical Activity:   . Days of Exercise per Week: Not on file  . Minutes of Exercise per Session: Not on file  Stress:   . Feeling of Stress : Not on file  Social Connections:   . Frequency of Communication with Friends and Family: Not on file  . Frequency of Social Gatherings with Friends and Family: Not on file  . Attends Religious Services: Not on file  . Active Member of Clubs or Organizations: Not on file  . Attends Archivist Meetings: Not on file  .  Marital Status: Not on file   Family History  Problem Relation Age of Onset  . Hypertension Mother   . COPD Mother   . Cancer Mother        thoat cancer  . Hypertension Father   . Cancer Paternal Aunt        pancreatic cancer  . Cancer Paternal Uncle    Outpatient Encounter Medications as of 10/19/2020  Medication Sig  . atorvastatin (LIPITOR) 20 MG tablet Take 1 tablet (20 mg total) by mouth daily.  . diphenhydramine-acetaminophen (TYLENOL PM) 25-500 MG TABS tablet Take 1 tablet by mouth at bedtime as needed.  Marland Kitchen PARoxetine (PAXIL) 40 MG tablet Take 1 tablet (40 mg total) by mouth every morning.   No facility-administered encounter medications on file as of 10/19/2020.   ALLERGIES: No Known Allergies  VACCINATION STATUS: Immunization History  Administered Date(s) Administered  . Moderna SARS-COVID-2 Vaccination 05/04/2020, 06/01/2020    HPI Denise Ford  is 59 y.o. female who presents today to review her biopsy results after she was seen in consultation for nodular goiter last visit.   PCP:  Soyla Dryer, PA-C.   She is known to have nodular goiter since 2016.  She is not on any thyroid hormone supplements or antithyroid medications.  Her most recent thyroid ultrasound on August 14, 2020 showed slight increase in the size of a nodule on the right lobe of her thyroid to 2.6 cm.   -She underwent fine-needle aspiration biopsy of right lobe thyroid nodule with benign outcomes-see below.  She denies dysphagia, shortness of breath.  She is a chronic heavy smoker, reports some voice change recently.  Her voice became more hoarse lately.  -She denies weight loss, palpitations, heat/cold intolerance.  She has some unidentified thyroid dysfunction in one of her grandparents.     Review of Systems  Limited as above.  Objective:    Vitals with BMI 10/19/2020 10/10/2020 10/10/2020  Height 5' 3.25" - 5\' 3"   Weight 164 lbs - 165 lbs  BMI 15.17 - 61.60  Systolic 737 106 97   Diastolic 68 70 66  Pulse 72 68 64    BP 100/68   Pulse 72   Ht 5' 3.25" (1.607 m)   Wt 164 lb (74.4 kg)   BMI 28.82 kg/m   Wt Readings from Last 3 Encounters:  10/19/20 164 lb (74.4 kg)  10/10/20 165 lb (74.8 kg)  10/03/20 166 lb (75.3 kg)    Physical Exam  Constitutional:  Body mass index is 28.82 kg/m.,  not in acute distress, normal state of mind Eyes: PERRLA, EOMI, no exophthalmos ENT: moist mucous membranes, + gross thyromegaly, no gross cervical lymphadenopathy   CMP ( most recent) CMP     Component Value Date/Time   NA 137 08/22/2020 0949   K 4.9 08/22/2020 0949   CL 103 08/22/2020 0949   CO2 27 08/22/2020 0949   GLUCOSE 94 08/22/2020 0949   BUN 14 08/22/2020 0949   CREATININE 0.69 08/22/2020 0949   CALCIUM 10.0 08/22/2020 0949   PROT 7.4 08/22/2020 0949   ALBUMIN 4.2 08/22/2020 0949   AST 17 08/22/2020 0949   ALT 13 08/22/2020 0949   ALKPHOS 60 08/22/2020 0949   BILITOT 0.4 08/22/2020 0949   GFRNONAA >60 08/22/2020 0949   GFRAA >60 08/22/2020 0949     Diabetic Labs (most recent): Lab Results  Component Value Date   HGBA1C 5.4 08/22/2020     Lipid Panel ( most recent) Lipid Panel     Component Value Date/Time   CHOL 330 (H) 08/22/2020 0948   TRIG 305 (H) 08/22/2020 0948   HDL 67 08/22/2020 0948   CHOLHDL 4.9 08/22/2020 0948   VLDL 61 (H) 08/22/2020 0948   LDLCALC 202 (H) 08/22/2020 0948      Lab Results  Component Value Date   TSH 1.836 08/22/2020   FREET4 0.77 08/22/2020     Thyroid ultrasound on August 14, 2020: Right lobe measured 5.4 cm with 2.6 cm nodule which increased in size from 2.2 cm in 2016 Left lobe measured 4.3 cm with no discrete nodules.   Fine-needle aspiration of right lobe nodule on October 10, 2020 FINAL MICROSCOPIC DIAGNOSIS:  - Consistent with benign follicular nodule (Bethesda category II)  SPECIMEN ADEQUACY:  Satisfactory for evaluation    Assessment & Plan:   1. Nodular goiter-benign FNA 2.  Current smoker -Her fine-needle aspiration biopsy is negative for malignancy. -  she has euthyroid  nodular goiter.  -She would not need surgery or antithyroid intervention at this time.  She will return in 1 year with repeat thyroid function tests.  The patient was counseled on the dangers of tobacco use, and was advised to quit.  Reviewed strategies to maximize success, including removing cigarettes and smoking materials from environment.   - I did not initiate any new prescriptions today. - she is advised to maintain close follow up with Soyla Dryer, PA-C for primary care needs.     - Time spent on this patient care encounter:  20 minutes of which 50% was spent in  counseling and the rest reviewing  her current and  previous labs / studies and medications  doses and developing a plan for long term care. Denise Ford  participated in the discussions, expressed understanding, and voiced agreement with the above plans.  All questions were answered to her satisfaction. she is encouraged to contact clinic should she have any questions or concerns prior to her return visit.  Follow up plan: Return in about 1 year (around 10/19/2021) for F/U with Pre-visit Labs.   Glade Lloyd, MD [facility] [address] [place], [place] [address] Phone: [phone]  Fax: [phone]     10/19/2020, 10:22 AM  This note was partially dictated with voice recognition software. Similar sounding words can be transcribed inadequately or may not  be corrected upon review.

## 2020-10-29 ENCOUNTER — Encounter: Payer: Self-pay | Admitting: Physician Assistant

## 2020-10-29 NOTE — Progress Notes (Unsigned)
Pt called stating she hasn't received paxil 40mg  that

## 2020-11-14 ENCOUNTER — Other Ambulatory Visit: Payer: Self-pay | Admitting: Physician Assistant

## 2020-11-14 MED ORDER — PAROXETINE HCL 20 MG PO TABS
20.0000 mg | ORAL_TABLET | Freq: Every day | ORAL | Status: DC
Start: 2020-11-14 — End: 2021-03-18

## 2020-11-29 ENCOUNTER — Other Ambulatory Visit (HOSPITAL_COMMUNITY)
Admission: RE | Admit: 2020-11-29 | Discharge: 2020-11-29 | Disposition: A | Discharge status: Home / Self Care | Payer: Self-pay | Source: Ambulatory Visit | Attending: Physician Assistant | Admitting: Physician Assistant

## 2020-11-29 ENCOUNTER — Other Ambulatory Visit: Payer: Self-pay

## 2020-11-29 DIAGNOSIS — E785 Hyperlipidemia, unspecified: Secondary | ICD-10-CM | POA: Insufficient documentation

## 2020-11-29 LAB — COMPREHENSIVE METABOLIC PANEL
ALT: 14 U/L (ref 0–44)
AST: 17 U/L (ref 15–41)
Albumin: 3.8 g/dL (ref 3.5–5.0)
Alkaline Phosphatase: 66 U/L (ref 38–126)
Anion gap: 7 (ref 5–15)
BUN: 8 mg/dL (ref 6–20)
CO2: 30 mmol/L (ref 22–32)
Calcium: 9.6 mg/dL (ref 8.9–10.3)
Chloride: 101 mmol/L (ref 98–111)
Creatinine, Ser: 0.77 mg/dL (ref 0.44–1.00)
GFR, Estimated: >60 mL/min (ref >60)
Glucose, Bld: 129 mg/dL — ABNORMAL HIGH (ref 70–99)
Potassium: 3.6 mmol/L (ref 3.5–5.1)
Sodium: 138 mmol/L (ref 135–145)
Total Bilirubin: 0.6 mg/dL (ref 0.3–1.2)
Total Protein: 7 g/dL (ref 6.5–8.1)

## 2020-11-29 LAB — LIPID PANEL
Cholesterol: 177 mg/dL (ref 0–200)
HDL: 54 mg/dL (ref >40)
LDL Cholesterol: 98 mg/dL (ref 0–99)
Total CHOL/HDL Ratio: 3.3 RATIO
Triglycerides: 126 mg/dL (ref <150)
VLDL: 25 mg/dL (ref 0–40)

## 2020-12-03 ENCOUNTER — Ambulatory Visit: Payer: Self-pay | Admitting: Physician Assistant

## 2020-12-04 ENCOUNTER — Encounter: Payer: Self-pay | Admitting: Physician Assistant

## 2020-12-11 ENCOUNTER — Ambulatory Visit: Payer: Self-pay | Admitting: Physician Assistant

## 2020-12-11 ENCOUNTER — Encounter: Payer: Self-pay | Admitting: Physician Assistant

## 2020-12-11 VITALS — BP 94/66 | HR 65 | Temp 94.9°F | Ht 63.25 in | Wt 166.4 lb

## 2020-12-11 DIAGNOSIS — F419 Anxiety disorder, unspecified: Secondary | ICD-10-CM

## 2020-12-11 DIAGNOSIS — E785 Hyperlipidemia, unspecified: Secondary | ICD-10-CM

## 2020-12-11 DIAGNOSIS — F17200 Nicotine dependence, unspecified, uncomplicated: Secondary | ICD-10-CM

## 2020-12-11 DIAGNOSIS — Z636 Dependent relative needing care at home: Secondary | ICD-10-CM

## 2020-12-11 NOTE — Progress Notes (Signed)
BP 94/66    Pulse 65    Temp (!) 94.9 F (34.9 C)    Ht 5' 3.25" (1.607 m)    Wt 166 lb 6.4 oz (75.5 kg)    SpO2 96%    BMI 29.24 kg/m    Subjective:    Patient ID: Denise Ford, female    DOB: 1961/11/14, 59 y.o.   MRN: [id number]  HPI: Denise Ford is a 59 y.o. female presenting on 12/11/2020 for Hyperlipidemia and Mental Health Problem   HPI   Pt had a negative covid 19 screening questionnaire.   Chief Complaint  Patient presents with   Hyperlipidemia   Mental Health Problem     She cut back to taking 20mg  paxil because she says it makes her "lazy".  She is still caring for her husband who has stage 4 cancer.  She talked with someone yesterday about support groups.  She is expecting a call back.    That is with [employer] program out of [place].  She is still smoking but has cut back some.      Relevant past medical, surgical, family and social history reviewed and updated as indicated. Interim medical history since our last visit reviewed. Allergies and medications reviewed and updated.   Current Outpatient Medications:    atorvastatin (LIPITOR) 20 MG tablet, Take 1 tablet (20 mg total) by mouth daily., Disp: 90 tablet, Rfl: 1   diphenhydramine-acetaminophen (TYLENOL PM) 25-500 MG TABS tablet, Take 1 tablet by mouth at bedtime as needed., Disp: , Rfl:    PARoxetine (PAXIL) 20 MG tablet, Take 1 tablet (20 mg total) by mouth daily., Disp: 30 tablet, Rfl: 00   PARoxetine (PAXIL) 40 MG tablet, Take 1 tablet (40 mg total) by mouth every morning. (Patient not taking: Reported on 12/11/2020), Disp: 90 tablet, Rfl: 0    Review of Systems  Per HPI unless specifically indicated above     Objective:    BP 94/66    Pulse 65    Temp (!) 94.9 F (34.9 C)    Ht 5' 3.25" (1.607 m)    Wt 166 lb 6.4 oz (75.5 kg)    SpO2 96%    BMI 29.24 kg/m   Wt Readings from Last 3 Encounters:  12/11/20 166 lb 6.4 oz (75.5 kg)  10/19/20 164 lb (74.4 kg)  10/10/20  165 lb (74.8 kg)    Physical Exam Vitals reviewed.  Constitutional:      Appearance: She is well-developed and well-nourished.  HENT:     Head: Normocephalic and atraumatic.  Cardiovascular:     Rate and Rhythm: Normal rate and regular rhythm.  Pulmonary:     Effort: Pulmonary effort is normal.     Breath sounds: Normal breath sounds.  Abdominal:     General: Bowel sounds are normal.     Palpations: Abdomen is soft. There is no hepatosplenomegaly or mass.     Tenderness: There is no abdominal tenderness.  Musculoskeletal:        General: No edema.     Cervical back: Neck supple.  Lymphadenopathy:     Cervical: No cervical adenopathy.  Skin:    General: Skin is warm and dry.  Neurological:     Mental Status: She is alert and oriented to person, place, and time.  Psychiatric:        Mood and Affect: Mood and affect normal.        Behavior: Behavior normal.  Results for orders placed or performed during the hospital encounter of 11/29/20  Lipid panel  Result Value Ref Range   Cholesterol 177 0 - 200 mg/dL   Triglycerides 126 <150 mg/dL   HDL 54 >40 mg/dL   Total CHOL/HDL Ratio 3.3 RATIO   VLDL 25 0 - 40 mg/dL   LDL Cholesterol 98 0 - 99 mg/dL  Comprehensive metabolic panel  Result Value Ref Range   Sodium 138 135 - 145 mmol/L   Potassium 3.6 3.5 - 5.1 mmol/L   Chloride 101 98 - 111 mmol/L   CO2 30 22 - 32 mmol/L   Glucose, Bld 129 (H) 70 - 99 mg/dL   BUN 8 6 - 20 mg/dL   Creatinine, Ser 0.77 0.44 - 1.00 mg/dL   Calcium 9.6 8.9 - 10.3 mg/dL   Total Protein 7.0 6.5 - 8.1 g/dL   Albumin 3.8 3.5 - 5.0 g/dL   AST 17 15 - 41 U/L   ALT 14 0 - 44 U/L   Alkaline Phosphatase 66 38 - 126 U/L   Total Bilirubin 0.6 0.3 - 1.2 mg/dL   GFR, Estimated >60 >60 mL/min   Anion gap 7 5 - 15      Assessment & Plan:   Encounter Diagnoses  Name Primary?   Hyperlipidemia, unspecified hyperlipidemia type Yes   Anxiety    Caregiver burden    Tobacco use disorder        -reviewed labs with pt  -pt wants to continue with the paxil 20mg  for now -she will continue atorvastatin and healthy diet for lipids -pt to follow up 3 months.  Will update PAP at that time.  She is to contact office sooner prn

## 2021-02-07 ENCOUNTER — Other Ambulatory Visit: Payer: Self-pay | Admitting: Physician Assistant

## 2021-02-26 ENCOUNTER — Other Ambulatory Visit: Payer: Self-pay | Admitting: Physician Assistant

## 2021-02-26 DIAGNOSIS — E785 Hyperlipidemia, unspecified: Secondary | ICD-10-CM

## 2021-03-08 ENCOUNTER — Other Ambulatory Visit (HOSPITAL_COMMUNITY)
Admission: RE | Admit: 2021-03-08 | Discharge: 2021-03-08 | Disposition: A | Discharge status: Home / Self Care | Payer: Self-pay | Source: Ambulatory Visit | Attending: Physician Assistant | Admitting: Physician Assistant

## 2021-03-08 ENCOUNTER — Other Ambulatory Visit: Payer: Self-pay

## 2021-03-08 DIAGNOSIS — E785 Hyperlipidemia, unspecified: Secondary | ICD-10-CM | POA: Insufficient documentation

## 2021-03-08 LAB — LIPID PANEL
Cholesterol: 168 mg/dL (ref 0–200)
HDL: 52 mg/dL (ref >40)
LDL Cholesterol: 75 mg/dL (ref 0–99)
Total CHOL/HDL Ratio: 3.2 RATIO
Triglycerides: 206 mg/dL — ABNORMAL HIGH (ref <150)
VLDL: 41 mg/dL — ABNORMAL HIGH (ref 0–40)

## 2021-03-08 LAB — COMPREHENSIVE METABOLIC PANEL
ALT: 39 U/L (ref 0–44)
AST: 32 U/L (ref 15–41)
Albumin: 4 g/dL (ref 3.5–5.0)
Alkaline Phosphatase: 81 U/L (ref 38–126)
Anion gap: 9 (ref 5–15)
BUN: 8 mg/dL (ref 6–20)
CO2: 27 mmol/L (ref 22–32)
Calcium: 9.4 mg/dL (ref 8.9–10.3)
Chloride: 101 mmol/L (ref 98–111)
Creatinine, Ser: 0.59 mg/dL (ref 0.44–1.00)
GFR, Estimated: >60 mL/min (ref >60)
Glucose, Bld: 119 mg/dL — ABNORMAL HIGH (ref 70–99)
Potassium: 3.4 mmol/L — ABNORMAL LOW (ref 3.5–5.1)
Sodium: 137 mmol/L (ref 135–145)
Total Bilirubin: 0.3 mg/dL (ref 0.3–1.2)
Total Protein: 6.7 g/dL (ref 6.5–8.1)

## 2021-03-13 ENCOUNTER — Ambulatory Visit: Payer: Self-pay | Admitting: Physician Assistant

## 2021-03-18 ENCOUNTER — Ambulatory Visit: Payer: Self-pay | Admitting: Physician Assistant

## 2021-03-18 ENCOUNTER — Encounter: Payer: Self-pay | Admitting: Physician Assistant

## 2021-03-18 ENCOUNTER — Other Ambulatory Visit: Payer: Self-pay

## 2021-03-18 ENCOUNTER — Other Ambulatory Visit (HOSPITAL_COMMUNITY)
Admission: RE | Admit: 2021-03-18 | Discharge: 2021-03-18 | Disposition: A | Discharge status: Home / Self Care | Payer: Self-pay | Source: Ambulatory Visit | Attending: Physician Assistant | Admitting: Physician Assistant

## 2021-03-18 VITALS — BP 106/72 | HR 77 | Temp 98.0°F | Wt 152.0 lb

## 2021-03-18 DIAGNOSIS — E041 Nontoxic single thyroid nodule: Secondary | ICD-10-CM

## 2021-03-18 DIAGNOSIS — F419 Anxiety disorder, unspecified: Secondary | ICD-10-CM

## 2021-03-18 DIAGNOSIS — E785 Hyperlipidemia, unspecified: Secondary | ICD-10-CM

## 2021-03-18 DIAGNOSIS — F17200 Nicotine dependence, unspecified, uncomplicated: Secondary | ICD-10-CM

## 2021-03-18 DIAGNOSIS — Z124 Encounter for screening for malignant neoplasm of cervix: Secondary | ICD-10-CM

## 2021-03-18 DIAGNOSIS — Z636 Dependent relative needing care at home: Secondary | ICD-10-CM

## 2021-03-18 MED ORDER — ATORVASTATIN CALCIUM 20 MG PO TABS: ORAL_TABLET | ORAL | 1 refills | Status: DC

## 2021-03-18 MED ORDER — BUSPIRONE HCL 10 MG PO TABS: 10.0000 mg | ORAL_TABLET | Freq: Two times a day (BID) | ORAL | 0 refills | Status: DC

## 2021-03-18 NOTE — Progress Notes (Signed)
BP 106/72   Pulse 77   Temp 98 F (36.7 C)   Wt 152 lb (68.9 kg)   SpO2 100%   BMI 26.71 kg/m    Subjective:    Patient ID: Denise Ford, female    DOB: 23-Dec-1961, 60 y.o.   MRN: [id number]  HPI: Denise Ford is a 60 y.o. female presenting on 03/18/2021 for Hyperlipidemia, Mental Health Problem, and Gynecologic Exam   HPI    Pt had a negative covid 19 screening questionnaire.  Chief Complaint  Patient presents with  . Hyperlipidemia  . Mental Health Problem  . Gynecologic Exam     She is still caring for her husband with cancer.  They do get help from [facility].  She is still smoking.    She is currently taking paxil-  Started at 20mg .  It was increased to 40mg .   40 mg made her not feel right and made her "lazy". She has never tried anything except paxil.  She feels like it doesn't really do much for her.  She has mostly anxiety.  Also has trouble sleeping.   She seend endocrinology for thyroid nodule  mammogram done sept 2021  She got first 2 covid vaccination but is Not boosted.     Relevant past medical, surgical, family and social history reviewed and updated as indicated. Interim medical history since our last visit reviewed. Allergies and medications reviewed and updated.    Current Outpatient Medications:  .  PARoxetine (PAXIL) 40 MG tablet, Take 1 tablet (40 mg total) by mouth every morning., Disp: 90 tablet, Rfl: 0 .  atorvastatin (LIPITOR) 20 MG tablet, TAKE 1 Tablet BY MOUTH ONCE EVERY DAY (Patient not taking: Reported on 03/18/2021), Disp: 90 tablet, Rfl: 1  Review of Systems  Per HPI unless specifically indicated above     Objective:    BP 106/72   Pulse 77   Temp 98 F (36.7 C)   Wt 152 lb (68.9 kg)   SpO2 100%   BMI 26.71 kg/m   Wt Readings from Last 3 Encounters:  03/18/21 152 lb (68.9 kg)  12/11/20 166 lb 6.4 oz (75.5 kg)  10/19/20 164 lb (74.4 kg)    Physical Exam Vitals and nursing note reviewed.   Constitutional:      General: She is not in acute distress.    Appearance: She is well-developed. She is not toxic-appearing.  HENT:     Head: Normocephalic and atraumatic.  Cardiovascular:     Rate and Rhythm: Normal rate and regular rhythm.  Pulmonary:     Effort: Pulmonary effort is normal. No respiratory distress.     Breath sounds: Normal breath sounds.  Abdominal:     Palpations: Abdomen is soft. There is no mass.     Tenderness: There is no abdominal tenderness. There is no guarding or rebound.  Genitourinary:    Labia:        Right: No rash, tenderness or lesion.        Left: No rash, tenderness or lesion.      Vagina: Normal.     Cervix: No cervical motion tenderness, discharge or friability.     Adnexa:        Right: No mass, tenderness or fullness.         Left: No mass, tenderness or fullness.       Comments: (CMA Mekeia assisted) Musculoskeletal:     Right lower leg: No edema.     Left  lower leg: No edema.  Skin:    General: Skin is warm and dry.  Neurological:     Mental Status: She is alert and oriented to person, place, and time.  Psychiatric:        Attention and Perception: Attention normal.        Speech: Speech normal.        Behavior: Behavior normal. Behavior is cooperative.     Results for orders placed or performed during the hospital encounter of 03/08/21  Lipid panel  Result Value Ref Range   Cholesterol 168 0 - 200 mg/dL   Triglycerides 206 (H) <150 mg/dL   HDL 52 >40 mg/dL   Total CHOL/HDL Ratio 3.2 RATIO   VLDL 41 (H) 0 - 40 mg/dL   LDL Cholesterol 75 0 - 99 mg/dL  Comprehensive metabolic panel  Result Value Ref Range   Sodium 137 135 - 145 mmol/L   Potassium 3.4 (L) 3.5 - 5.1 mmol/L   Chloride 101 98 - 111 mmol/L   CO2 27 22 - 32 mmol/L   Glucose, Bld 119 (H) 70 - 99 mg/dL   BUN 8 6 - 20 mg/dL   Creatinine, Ser 0.59 0.44 - 1.00 mg/dL   Calcium 9.4 8.9 - 10.3 mg/dL   Total Protein 6.7 6.5 - 8.1 g/dL   Albumin 4.0 3.5 - 5.0 g/dL    AST 32 15 - 41 U/L   ALT 39 0 - 44 U/L   Alkaline Phosphatase 81 38 - 126 U/L   Total Bilirubin 0.3 0.3 - 1.2 mg/dL   GFR, Estimated >60 >60 mL/min   Anion gap 9 5 - 15      Assessment & Plan:    Encounter Diagnoses  Name Primary?  . Routine Papanicolaou smear Yes  . Hyperlipidemia, unspecified hyperlipidemia type   . Anxiety   . Tobacco use disorder   . Caregiver burden   . Thyroid nodule      -reviewed labs with pt -since pt feels paxil isn't doing much, will try buspar -pt counseled to avoid running out of atorvastatin.  Follow lowfat diet -encouraged Smoking cessation -encouraged pt to contact [facility] for counseling -encouraged pt to get covid booster -F/u mood 4 wk (1 wk to get meds in mail, 3 wk to be on meds).  She is to contact office sooner prn

## 2021-03-20 LAB — CYTOLOGY - PAP
Adequacy: ABSENT
Comment: NEGATIVE
Diagnosis: NEGATIVE
High risk HPV: NEGATIVE

## 2021-04-15 ENCOUNTER — Encounter: Payer: Self-pay | Admitting: Physician Assistant

## 2021-04-15 ENCOUNTER — Ambulatory Visit: Payer: Self-pay | Admitting: Physician Assistant

## 2021-04-15 DIAGNOSIS — F419 Anxiety disorder, unspecified: Secondary | ICD-10-CM

## 2021-04-15 DIAGNOSIS — E785 Hyperlipidemia, unspecified: Secondary | ICD-10-CM

## 2021-04-15 DIAGNOSIS — F17200 Nicotine dependence, unspecified, uncomplicated: Secondary | ICD-10-CM

## 2021-04-15 NOTE — Progress Notes (Signed)
   There were no vitals taken for this visit.   Subjective:    Patient ID: Denise Ford, female    DOB: April 14, 1961, 60 y.o.   MRN: [id number]  HPI: Denise Ford is a 60 y.o. female presenting on 04/15/2021 for No chief complaint on file.   HPI   This is at telemedicine appointment due to coronavirus pandemic.  It is via telephone as pt does not have a video enabled device.  I connected with  Denise Ford on 04/15/21 by a video enabled telemedicine application and verified that I am speaking with the correct person using two identifiers.   I discussed the limitations of evaluation and management by telemedicine. The patient expressed understanding and agreed to proceed.  Pt is at home.  Provider is at office.   Appointment today is to follow up on mood.  Pt was started on buspar at appointment on 03/18/21.  She only took the buspar for about a week.  She says the medication made her mean.   She has more anxiety.  She doesn't feel like it doesn't bothers her now.  She is got good news related to her hur husbands lung cancer.  She says she is just so happy about that she isn't feeling much depression or anxiety.         Relevant past medical, surgical, family and social history reviewed and updated as indicated. Interim medical history since our last visit reviewed. Allergies and medications reviewed and updated.   Current Outpatient Medications:  .  atorvastatin (LIPITOR) 20 MG tablet, TAKE 1 Tablet BY MOUTH ONCE EVERY DAY, Disp: 90 tablet, Rfl: 1 .  busPIRone (BUSPAR) 10 MG tablet, Take 1 tablet (10 mg total) by mouth 2 (two) times daily. (Patient not taking: Reported on 04/15/2021), Disp: 180 tablet, Rfl: 0     Review of Systems  Per HPI unless specifically indicated above     Objective:    There were no vitals taken for this visit.  Wt Readings from Last 3 Encounters:  03/18/21 152 lb (68.9 kg)  12/11/20 166 lb 6.4 oz (75.5 kg)  10/19/20 164 lb (74.4 kg)     Physical Exam Pulmonary:     Effort: No respiratory distress.     Comments: Pt is talking in complete sentences without sob.  Neurological:     Mental Status: She is alert and oriented to person, place, and time.  Psychiatric:        Attention and Perception: Attention normal.        Mood and Affect: Mood normal. Affect is not inappropriate.        Speech: Speech normal.        Behavior: Behavior is cooperative.             Assessment & Plan:   Encounter Diagnoses  Name Primary?  Marland Kitchen Anxiety Yes  . Tobacco use disorder   . Hyperlipidemia, unspecified hyperlipidemia type      Pt says she doesn't feel like she needs anything at this time to help with her anxiety or depression.    She will follow up in 3 months for recheck on dyslipidemia.  She will contact office sooner if she feels like she wants to get back on some medication for anxiety/depression

## 2021-06-07 ENCOUNTER — Other Ambulatory Visit: Payer: Self-pay | Admitting: Physician Assistant

## 2021-06-26 ENCOUNTER — Other Ambulatory Visit: Payer: Self-pay | Admitting: Physician Assistant

## 2021-06-26 DIAGNOSIS — E785 Hyperlipidemia, unspecified: Secondary | ICD-10-CM

## 2021-07-03 ENCOUNTER — Other Ambulatory Visit (HOSPITAL_COMMUNITY)
Admission: RE | Admit: 2021-07-03 | Discharge: 2021-07-03 | Disposition: A | Discharge status: Home / Self Care | Payer: Self-pay | Source: Ambulatory Visit | Attending: Physician Assistant | Admitting: Physician Assistant

## 2021-07-03 ENCOUNTER — Other Ambulatory Visit: Payer: Self-pay

## 2021-07-03 DIAGNOSIS — E785 Hyperlipidemia, unspecified: Secondary | ICD-10-CM | POA: Insufficient documentation

## 2021-07-03 LAB — LIPID PANEL
Cholesterol: 172 mg/dL (ref 0–200)
HDL: 76 mg/dL (ref >40)
LDL Cholesterol: 81 mg/dL (ref 0–99)
Total CHOL/HDL Ratio: 2.3 RATIO
Triglycerides: 77 mg/dL (ref <150)
VLDL: 15 mg/dL (ref 0–40)

## 2021-07-03 LAB — COMPREHENSIVE METABOLIC PANEL
ALT: 15 U/L (ref 0–44)
AST: 16 U/L (ref 15–41)
Albumin: 4.2 g/dL (ref 3.5–5.0)
Alkaline Phosphatase: 66 U/L (ref 38–126)
Anion gap: 6 (ref 5–15)
BUN: 17 mg/dL (ref 6–20)
CO2: 29 mmol/L (ref 22–32)
Calcium: 9.9 mg/dL (ref 8.9–10.3)
Chloride: 104 mmol/L (ref 98–111)
Creatinine, Ser: 0.69 mg/dL (ref 0.44–1.00)
GFR, Estimated: >60 mL/min (ref >60)
Glucose, Bld: 106 mg/dL — ABNORMAL HIGH (ref 70–99)
Potassium: 4.1 mmol/L (ref 3.5–5.1)
Sodium: 139 mmol/L (ref 135–145)
Total Bilirubin: 0.6 mg/dL (ref 0.3–1.2)
Total Protein: 7.2 g/dL (ref 6.5–8.1)

## 2021-07-08 ENCOUNTER — Encounter: Payer: Self-pay | Admitting: Physician Assistant

## 2021-07-08 ENCOUNTER — Other Ambulatory Visit: Payer: Self-pay

## 2021-07-08 ENCOUNTER — Ambulatory Visit: Payer: Self-pay | Admitting: Physician Assistant

## 2021-07-08 VITALS — BP 115/59 | HR 72 | Temp 98.6°F | Wt 156.0 lb

## 2021-07-08 DIAGNOSIS — F419 Anxiety disorder, unspecified: Secondary | ICD-10-CM

## 2021-07-08 DIAGNOSIS — Z636 Dependent relative needing care at home: Secondary | ICD-10-CM

## 2021-07-08 DIAGNOSIS — Z1239 Encounter for other screening for malignant neoplasm of breast: Secondary | ICD-10-CM

## 2021-07-08 DIAGNOSIS — Z1211 Encounter for screening for malignant neoplasm of colon: Secondary | ICD-10-CM

## 2021-07-08 DIAGNOSIS — F17200 Nicotine dependence, unspecified, uncomplicated: Secondary | ICD-10-CM

## 2021-07-08 DIAGNOSIS — E785 Hyperlipidemia, unspecified: Secondary | ICD-10-CM

## 2021-07-08 MED ORDER — ATORVASTATIN CALCIUM 20 MG PO TABS: ORAL_TABLET | ORAL | 1 refills | Status: DC

## 2021-07-08 NOTE — Progress Notes (Signed)
BP (!) 115/59   Pulse 72   Temp 98.6 F (37 C)   Wt 156 lb (70.8 kg)   SpO2 98%   BMI 27.42 kg/m    Subjective:    Patient ID: Denise Ford, female    DOB: 12-25-1961, 60 y.o.   MRN: [id number]  HPI: Denise Ford is a 60 y.o. female presenting on 07/08/2021 for Hyperlipidemia and mood   HPI   Pt had a negative covid 19 screening questionnaire.    Chief Complaint  Patient presents with   Hyperlipidemia   mood   Pt says her mood is doing okay.  She is not having any SI or HI.  She says she thinks she does not need any medication at this time.    She has no complaints today.    Relevant past medical, surgical, family and social history reviewed and updated as indicated. Interim medical history since our last visit reviewed. Allergies and medications reviewed and updated.   Current Outpatient Medications:    atorvastatin (LIPITOR) 20 MG tablet, TAKE 1 Tablet BY MOUTH ONCE EVERY DAY, Disp: 90 tablet, Rfl: 1    Review of Systems  Per HPI unless specifically indicated above     Objective:    BP (!) 115/59   Pulse 72   Temp 98.6 F (37 C)   Wt 156 lb (70.8 kg)   SpO2 98%   BMI 27.42 kg/m   Wt Readings from Last 3 Encounters:  07/08/21 156 lb (70.8 kg)  03/18/21 152 lb (68.9 kg)  12/11/20 166 lb 6.4 oz (75.5 kg)    Physical Exam Vitals reviewed.  Constitutional:      General: She is not in acute distress.    Appearance: She is well-developed. She is not ill-appearing.  HENT:     Head: Normocephalic and atraumatic.  Cardiovascular:     Rate and Rhythm: Normal rate and regular rhythm.  Pulmonary:     Effort: Pulmonary effort is normal.     Breath sounds: Normal breath sounds.  Abdominal:     General: Bowel sounds are normal.     Palpations: Abdomen is soft. There is no mass.     Tenderness: There is no abdominal tenderness.  Musculoskeletal:     Cervical back: Neck supple.     Right lower leg: No edema.     Left lower leg: No edema.   Lymphadenopathy:     Cervical: No cervical adenopathy.  Skin:    General: Skin is warm and dry.  Neurological:     Mental Status: She is alert and oriented to person, place, and time.  Psychiatric:        Behavior: Behavior normal.    Results for orders placed or performed during the hospital encounter of 07/03/21  Lipid panel  Result Value Ref Range   Cholesterol 172 0 - 200 mg/dL   Triglycerides 77 <150 mg/dL   HDL 76 >40 mg/dL   Total CHOL/HDL Ratio 2.3 RATIO   VLDL 15 0 - 40 mg/dL   LDL Cholesterol 81 0 - 99 mg/dL  Comprehensive metabolic panel  Result Value Ref Range   Sodium 139 135 - 145 mmol/L   Potassium 4.1 3.5 - 5.1 mmol/L   Chloride 104 98 - 111 mmol/L   CO2 29 22 - 32 mmol/L   Glucose, Bld 106 (H) 70 - 99 mg/dL   BUN 17 6 - 20 mg/dL   Creatinine, Ser 0.69 0.44 - 1.00 mg/dL  Calcium 9.9 8.9 - 10.3 mg/dL   Total Protein 7.2 6.5 - 8.1 g/dL   Albumin 4.2 3.5 - 5.0 g/dL   AST 16 15 - 41 U/L   ALT 15 0 - 44 U/L   Alkaline Phosphatase 66 38 - 126 U/L   Total Bilirubin 0.6 0.3 - 1.2 mg/dL   GFR, Estimated >60 >60 mL/min   Anion gap 6 5 - 15      Assessment & Plan:    Encounter Diagnoses  Name Primary?   Hyperlipidemia, unspecified hyperlipidemia type Yes   Anxiety    Tobacco use disorder    Encounter for screening for malignant neoplasm of breast, unspecified screening modality    Screening for colon cancer    Caregiver burden      -Reviewed labs with pt  -screening mammogram after september 8 -pt was given FIT test for colon cancer screening  -pt to follow up 3 months.  She is to contact office sooner prn

## 2021-07-09 ENCOUNTER — Other Ambulatory Visit: Payer: Self-pay | Admitting: Physician Assistant

## 2021-07-09 DIAGNOSIS — Z1211 Encounter for screening for malignant neoplasm of colon: Secondary | ICD-10-CM

## 2021-07-10 LAB — IFOBT (OCCULT BLOOD): IFOBT: NEGATIVE

## 2021-07-17 ENCOUNTER — Other Ambulatory Visit: Payer: Self-pay | Admitting: Physician Assistant

## 2021-07-17 DIAGNOSIS — Z1239 Encounter for other screening for malignant neoplasm of breast: Secondary | ICD-10-CM

## 2021-09-23 ENCOUNTER — Ambulatory Visit (HOSPITAL_COMMUNITY): Payer: Self-pay

## 2021-09-25 ENCOUNTER — Other Ambulatory Visit: Payer: Self-pay | Admitting: Physician Assistant

## 2021-09-25 DIAGNOSIS — E785 Hyperlipidemia, unspecified: Secondary | ICD-10-CM

## 2021-10-02 ENCOUNTER — Ambulatory Visit (HOSPITAL_COMMUNITY)
Admission: RE | Admit: 2021-10-02 | Discharge: 2021-10-02 | Disposition: A | Discharge status: Home / Self Care | Payer: Self-pay | Source: Ambulatory Visit | Attending: Physician Assistant | Admitting: Physician Assistant

## 2021-10-02 ENCOUNTER — Other Ambulatory Visit (HOSPITAL_COMMUNITY)
Admission: RE | Admit: 2021-10-02 | Discharge: 2021-10-02 | Disposition: A | Discharge status: Home / Self Care | Payer: Self-pay | Source: Ambulatory Visit | Attending: Physician Assistant | Admitting: Physician Assistant

## 2021-10-02 ENCOUNTER — Other Ambulatory Visit: Payer: Self-pay

## 2021-10-02 DIAGNOSIS — E785 Hyperlipidemia, unspecified: Secondary | ICD-10-CM | POA: Insufficient documentation

## 2021-10-02 DIAGNOSIS — Z1239 Encounter for other screening for malignant neoplasm of breast: Secondary | ICD-10-CM | POA: Insufficient documentation

## 2021-10-02 LAB — HEPATIC FUNCTION PANEL
ALT: 18 U/L (ref 0–44)
AST: 19 U/L (ref 15–41)
Albumin: 4.6 g/dL (ref 3.5–5.0)
Alkaline Phosphatase: 75 U/L (ref 38–126)
Bilirubin, Direct: 0.1 mg/dL (ref 0.0–0.2)
Indirect Bilirubin: 0.6 mg/dL (ref 0.3–0.9)
Total Bilirubin: 0.7 mg/dL (ref 0.3–1.2)
Total Protein: 7.8 g/dL (ref 6.5–8.1)

## 2021-10-02 LAB — LIPID PANEL
Cholesterol: 155 mg/dL (ref 0–200)
HDL: 59 mg/dL (ref >40)
LDL Cholesterol: 80 mg/dL (ref 0–99)
Total CHOL/HDL Ratio: 2.6 RATIO
Triglycerides: 82 mg/dL (ref <150)
VLDL: 16 mg/dL (ref 0–40)

## 2021-10-08 ENCOUNTER — Ambulatory Visit: Payer: Self-pay | Admitting: Physician Assistant

## 2021-10-08 ENCOUNTER — Encounter: Payer: Self-pay | Admitting: Physician Assistant

## 2021-10-08 DIAGNOSIS — F17200 Nicotine dependence, unspecified, uncomplicated: Secondary | ICD-10-CM

## 2021-10-08 DIAGNOSIS — E785 Hyperlipidemia, unspecified: Secondary | ICD-10-CM

## 2021-10-08 DIAGNOSIS — Z636 Dependent relative needing care at home: Secondary | ICD-10-CM

## 2021-10-08 DIAGNOSIS — F439 Reaction to severe stress, unspecified: Secondary | ICD-10-CM

## 2021-10-08 MED ORDER — ATORVASTATIN CALCIUM 20 MG PO TABS: ORAL_TABLET | ORAL | 1 refills | Status: DC

## 2021-10-08 MED ORDER — CITALOPRAM HYDROBROMIDE 20 MG PO TABS: 20.0000 mg | ORAL_TABLET | Freq: Every day | ORAL | 0 refills | Status: DC

## 2021-10-08 NOTE — Progress Notes (Signed)
There were no vitals taken for this visit.   Subjective:    Patient ID: Denise Ford, female    DOB: [birth year], 60 y.o.   MRN: [id number]  HPI: Denise Ford is a 60 y.o. female presenting on 10/08/2021 for No chief complaint on file.   HPI   This is a telemedicine appointment through telephone due to pt does not have a video enabled device.    I connected with  Denise Ford on 10/08/21 by a video enabled telemedicine application and verified that I am speaking with the correct person using two identifiers.   I discussed the limitations of evaluation and management by telemedicine. The patient expressed understanding and agreed to proceed.  Pt is at home.  Provider is at office.   Pt is 34yoF who presents for follow up dyslipidemia.    She says she Wants to get back on antidepressant.  She says she has a lot of stress due to caring for her husband and she recently starting taking care of 3 grandchildren every day Monday-Friday.    She was on Buspar and says it made her angry.  Paxil made her have no feelings.  She wants to try something different.    She is having no HI, SI.      Relevant past medical, surgical, family and social history reviewed and updated as indicated. Interim medical history since our last visit reviewed. Allergies and medications reviewed and updated.   Current Outpatient Medications:    atorvastatin (LIPITOR) 20 MG tablet, TAKE 1 Tablet BY MOUTH ONCE EVERY DAY, Disp: 90 tablet, Rfl: 1    Review of Systems  Per HPI unless specifically indicated above     Objective:    There were no vitals taken for this visit.  Wt Readings from Last 3 Encounters:  07/08/21 156 lb (70.8 kg)  03/18/21 152 lb (68.9 kg)  12/11/20 166 lb 6.4 oz (75.5 kg)    Physical Exam Constitutional:      General: She is not in acute distress. Pulmonary:     Effort: No respiratory distress.     Comments: Pt is talking in complete sentences withhout  dyspnea. Neurological:     Mental Status: She is alert and oriented to person, place, and time.  Psychiatric:        Attention and Perception: Attention normal.        Speech: Speech normal.        Behavior: Behavior is cooperative.    Results for orders placed or performed during the hospital encounter of 10/02/21  Lipid panel  Result Value Ref Range   Cholesterol 155 0 - 200 mg/dL   Triglycerides 82 <150 mg/dL   HDL 59 >40 mg/dL   Total CHOL/HDL Ratio 2.6 RATIO   VLDL 16 0 - 40 mg/dL   LDL Cholesterol 80 0 - 99 mg/dL  Hepatic function panel  Result Value Ref Range   Total Protein 7.8 6.5 - 8.1 g/dL   Albumin 4.6 3.5 - 5.0 g/dL   AST 19 15 - 41 U/L   ALT 18 0 - 44 U/L   Alkaline Phosphatase 75 38 - 126 U/L   Total Bilirubin 0.7 0.3 - 1.2 mg/dL   Bilirubin, Direct 0.1 0.0 - 0.2 mg/dL   Indirect Bilirubin 0.6 0.3 - 0.9 mg/dL      Assessment & Plan:    Encounter Diagnoses  Name Primary?   Hyperlipidemia, unspecified hyperlipidemia type Yes   Tobacco use disorder  Caregiver burden    Stress      -reviewed labs with pt  -Trial of citaopram- rx sent to [facility] -pt to Continue atorvastatin and lowfat diet -Rescheduled flu shot and covid booster -pt to follow up 3 weeks to recheck mood.  She is to contact office sooner prn

## 2021-10-22 ENCOUNTER — Other Ambulatory Visit (HOSPITAL_COMMUNITY)
Admission: RE | Admit: 2021-10-22 | Discharge: 2021-10-22 | Disposition: A | Discharge status: Home / Self Care | Payer: Self-pay | Source: Ambulatory Visit | Attending: "Endocrinology | Admitting: "Endocrinology

## 2021-10-22 DIAGNOSIS — E049 Nontoxic goiter, unspecified: Secondary | ICD-10-CM | POA: Insufficient documentation

## 2021-10-22 LAB — T4, FREE: Free T4: 0.69 ng/dL (ref 0.61–1.12)

## 2021-10-22 LAB — TSH: TSH: 2.239 u[IU]/mL (ref 0.350–4.500)

## 2021-10-23 LAB — T3, FREE: T3, Free: 2.8 pg/mL (ref 2.0–4.4)

## 2021-10-25 ENCOUNTER — Encounter: Payer: Self-pay | Admitting: "Endocrinology

## 2021-10-25 ENCOUNTER — Other Ambulatory Visit: Payer: Self-pay

## 2021-10-25 ENCOUNTER — Ambulatory Visit (INDEPENDENT_AMBULATORY_CARE_PROVIDER_SITE_OTHER): Payer: Self-pay | Admitting: "Endocrinology

## 2021-10-25 VITALS — BP 132/78 | HR 88 | Ht 63.0 in | Wt 167.8 lb

## 2021-10-25 DIAGNOSIS — E049 Nontoxic goiter, unspecified: Secondary | ICD-10-CM

## 2021-10-25 NOTE — Progress Notes (Signed)
10/25/2021, 3:29 PM  Endocrinology follow-up note   Subjective:    Patient ID: Denise Ford, female    DOB: 1961/03/12, PCP Soyla Dryer, PA-C   Past Medical History:  Diagnosis Date   Anxiety    Hyperlipidemia    Past Surgical History:  Procedure Laterality Date   CESAREAN SECTION     x2   TUBAL LIGATION     Social History   Socioeconomic History   Marital status: Legally Separated    Spouse name: Not on file   Number of children: Not on file   Years of education: Not on file   Highest education level: Not on file  Occupational History   Not on file  Tobacco Use   Smoking status: Every Day    Packs/day: 1.00    Years: 30.00    Pack years: 30.00    Types: Cigarettes   Smokeless tobacco: Never  Vaping Use   Vaping Use: Never used  Substance and Sexual Activity   Alcohol use: Yes    Comment: occ.   Drug use: No   Sexual activity: Not on file  Other Topics Concern   Not on file  Social History Narrative   Not on file   Social Determinants of Health   Financial Resource Strain: Not on file  Food Insecurity: Not on file  Transportation Needs: Not on file  Physical Activity: Not on file  Stress: Not on file  Social Connections: Not on file   Family History  Problem Relation Age of Onset   Hypertension Mother    COPD Mother    Cancer Mother        thoat cancer   Hypertension Father    Cancer Paternal Aunt        pancreatic cancer   Cancer Paternal Uncle    Outpatient Encounter Medications as of 10/25/2021  Medication Sig   atorvastatin (LIPITOR) 20 MG tablet TAKE 1 Tablet BY MOUTH ONCE EVERY DAY   citalopram (CELEXA) 20 MG tablet Take 1 tablet (20 mg total) by mouth daily.   No facility-administered encounter medications on file as of 10/25/2021.   ALLERGIES: No Known Allergies  VACCINATION STATUS: Immunization History  Administered Date(s) Administered   Moderna Sars-Covid-2  Vaccination 05/04/2020, 06/01/2020    HPI Denise Ford is 60 y.o. female who presents today to review her biopsy results after she was seen in consultation for nodular goiter last visit.   PCP:  Soyla Dryer, PA-C.   She is known to have nodular goiter since 2016.  She is not on any thyroid hormone supplements or antithyroid medications.  Her last thyroid ultrasound was in August 2021.  She underwent fine-needle aspiration biopsy of this right lobe thyroid nodule with benign findings.    She has no new complaints today.  She continues to smoke.  She is a chronic heavy smoker, reports some voice change recently, continues to be horsy. She denies dysphagia, shortness of breath.   -She denies weight loss, palpitations, heat/cold intolerance.  She has some unidentified thyroid dysfunction in one of her grandparents.     Review of Systems  Limited as above.  Objective:    Vitals with BMI 10/25/2021 07/08/2021 03/18/2021  Height 5\' 3"  - -  Weight 167 lbs 13 oz 156 lbs 152 lbs  BMI 19.50 - -  Systolic 932 671 245  Diastolic 78 59 72  Pulse 88 72 77    BP 132/78   Pulse 88   Ht 5\' 3"  (1.6 m)   Wt 167 lb 12.8 oz (76.1 kg)   BMI 29.72 kg/m   Wt Readings from Last 3 Encounters:  10/25/21 167 lb 12.8 oz (76.1 kg)  07/08/21 156 lb (70.8 kg)  03/18/21 152 lb (68.9 kg)    Physical Exam  Constitutional:  Body mass index is 29.72 kg/m.,  not in acute distress, normal state of mind Eyes: PERRLA, EOMI, no exophthalmos ENT: moist mucous membranes, + gross thyromegaly, no gross cervical lymphadenopathy   CMP ( most recent) CMP     Component Value Date/Time   NA 139 07/03/2021 0940   K 4.1 07/03/2021 0940   CL 104 07/03/2021 0940   CO2 29 07/03/2021 0940   GLUCOSE 106 (H) 07/03/2021 0940   BUN 17 07/03/2021 0940   CREATININE 0.69 07/03/2021 0940   CALCIUM 9.9 07/03/2021 0940   PROT 7.8 10/02/2021 1018   ALBUMIN 4.6 10/02/2021 1018   AST 19 10/02/2021 1018   ALT 18  10/02/2021 1018   ALKPHOS 75 10/02/2021 1018   BILITOT 0.7 10/02/2021 1018   GFRNONAA >60 07/03/2021 0940   GFRAA >60 08/22/2020 0949     Diabetic Labs (most recent): Lab Results  Component Value Date   HGBA1C 5.4 08/22/2020     Lipid Panel ( most recent) Lipid Panel     Component Value Date/Time   CHOL 155 10/02/2021 1018   TRIG 82 10/02/2021 1018   HDL 59 10/02/2021 1018   CHOLHDL 2.6 10/02/2021 1018   VLDL 16 10/02/2021 1018   LDLCALC 80 10/02/2021 1018      Lab Results  Component Value Date   TSH 2.239 10/22/2021   TSH 1.836 08/22/2020   FREET4 0.69 10/22/2021   FREET4 0.77 08/22/2020     Thyroid ultrasound on August 14, 2020: Right lobe measured 5.4 cm with 2.6 cm nodule which increased in size from 2.2 cm in 2016 Left lobe measured 4.3 cm with no discrete nodules.   Fine-needle aspiration of right lobe nodule on October 10, 2020 FINAL MICROSCOPIC DIAGNOSIS:  - Consistent with benign follicular nodule (Bethesda category II)  SPECIMEN ADEQUACY:  Satisfactory for evaluation    Assessment & Plan:   1. Nodular goiter-benign FNA 2. Current smoker -Her fine-needle aspiration biopsy has been negative for malignancy.  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 -She will not need surgery or antithyroid intervention at this time.  She will return in 1 year with repeat thyroid function tests.  The patient was counseled on the dangers of tobacco use, and was advised to quit.  Reviewed strategies to maximize success, including removing cigarettes and smoking materials from environment.  If she continues to have voice hoarseness, she will benefit from evaluation by ENT given her history of heavy smoking.   - she is advised to maintain close follow up with Soyla Dryer, PA-C for primary care needs.    I spent 21 minutes in the care of the patient today including review of labs from Thyroid Function, CMP, and other relevant labs ; imaging/biopsy records  (current and previous including abstractions from other facilities); face-to-face time discussing  her lab results and symptoms, medications doses, her options of short and long term treatment  based on the latest standards of care / guidelines;   and documenting the encounter.  Denise Ford  participated in the discussions, expressed understanding, and voiced agreement with the above plans.  All questions were answered to her satisfaction. she is encouraged to contact clinic should she have any questions or concerns prior to her return visit.   Follow up plan: Return in about 1 year (around 10/25/2022) for F/U with Pre-visit Labs, Thyroid / Neck Ultrasound.   Glade Lloyd, MD [facility] [address] [place], [place] [address] Phone: [phone]  Fax: [phone]     10/25/2021, 3:29 PM  This note was partially dictated with voice recognition software. Similar sounding words can be transcribed inadequately or may not  be corrected upon review.

## 2021-11-04 ENCOUNTER — Ambulatory Visit: Payer: Self-pay | Admitting: Physician Assistant

## 2021-11-04 ENCOUNTER — Encounter: Payer: Self-pay | Admitting: Physician Assistant

## 2021-11-04 DIAGNOSIS — Z636 Dependent relative needing care at home: Secondary | ICD-10-CM

## 2021-11-04 DIAGNOSIS — F419 Anxiety disorder, unspecified: Secondary | ICD-10-CM

## 2021-11-04 DIAGNOSIS — F439 Reaction to severe stress, unspecified: Secondary | ICD-10-CM

## 2021-11-04 NOTE — Progress Notes (Signed)
   There were no vitals taken for this visit.   Subjective:    Patient ID: Denise Ford, female    DOB: 10-24-1961, 60 y.o.   MRN: [id number]  HPI: Denise Ford is a 60 y.o. female presenting on 11/04/2021 for No chief complaint on file.   HPI   This is a telemedicine appointment; it is via telephone as pt does not have a video-enabled device.  I connected with  Denise Ford on 11/04/21 by a video enabled telemedicine application and verified that I am speaking with the correct person using two identifiers.   I discussed the limitations of evaluation and management by telemedicine. The patient expressed understanding and agreed to proceed.  Pt is at home.  Provider is at office.    Pt is 55yoF with appointment to follow up mood.  She was started on citalopram at last appointment on 10/08/21.  She has been taking the medication for about 3 week now.  She thinks it is okay.  At first she says she doesn't feel any different but then says she isn't so anxious and is not so down.  She says that yes, she thinks it must be helping.   She is still caring for husband and 3 young grandchildren.     Relevant past medical, surgical, family and social history reviewed and updated as indicated. Interim medical history since our last visit reviewed. Allergies and medications reviewed and updated.   Current Outpatient Medications:    atorvastatin (LIPITOR) 20 MG tablet, TAKE 1 Tablet BY MOUTH ONCE EVERY DAY, Disp: 90 tablet, Rfl: 1   citalopram (CELEXA) 20 MG tablet, Take 1 tablet (20 mg total) by mouth daily., Disp: 90 tablet, Rfl: 0   Review of Systems  Per HPI unless specifically indicated above     Objective:    There were no vitals taken for this visit.  Wt Readings from Last 3 Encounters:  10/25/21 167 lb 12.8 oz (76.1 kg)  07/08/21 156 lb (70.8 kg)  03/18/21 152 lb (68.9 kg)    Physical Exam Pulmonary:     Effort: No respiratory distress.     Comments: Pt is  talking in complete sentences without dyspnea Neurological:     Mental Status: She is alert and oriented to person, place, and time.  Psychiatric:        Attention and Perception: Attention normal.        Mood and Affect: Affect is not inappropriate.        Speech: Speech normal.        Behavior: Behavior is cooperative.          Assessment & Plan:   Encounter Diagnoses  Name Primary?   Anxiety Yes   Caregiver burden    Stress      -Pt to continue citalopram.  She will follow up with me about a month.  She is to contact office sooner prn -discussed with pt that [facility] now has [facility] (behavioral health counselor) and asked if she would be interested and she says yes. She is told she will get call back about this

## 2021-11-05 ENCOUNTER — Telehealth: Payer: Self-pay | Admitting: Licensed Clinical Social Worker

## 2021-11-05 NOTE — Telephone Encounter (Signed)
[facility] called patient to schedule first counseling appointment. Appointment was scheduled for 11/16 at 9 am.

## 2021-11-13 ENCOUNTER — Ambulatory Visit: Payer: Self-pay | Admitting: Licensed Clinical Social Worker

## 2021-11-27 ENCOUNTER — Ambulatory Visit: Payer: Self-pay | Admitting: Licensed Clinical Social Worker

## 2021-11-27 DIAGNOSIS — F419 Anxiety disorder, unspecified: Secondary | ICD-10-CM

## 2021-11-27 NOTE — Progress Notes (Signed)
[facility] engaged patient in initial [facility] session. [facility] provided active listening and validation as client shared about anxiety related to husband's cancer and past traumas. Therapist discussed patient increasing her weekly exercise and lowering daily caffeine intake, patient verbalized agreement. Patient shared that she fears being alone and not being able to take care of herself. Patient self-reported that she previously used crack/cocaine for many years, has been clean for 8 years now.

## 2021-12-07 IMAGING — MG MM DIGITAL SCREENING BILAT W/ TOMO AND CAD
8 series · 8 of 24 positions shown · non-contrast
Comparison: Previous exam(s).

CLINICAL DATA: Screening.

EXAM:
DIGITAL SCREENING BILATERAL MAMMOGRAM WITH TOMOSYNTHESIS AND CAD
TECHNIQUE: Bilateral screening digital craniocaudal and mediolateral oblique
mammograms were obtained. Bilateral screening digital breast
tomosynthesis was performed. The images were evaluated with
computer-aided detection.

[L MLO synth-2D]
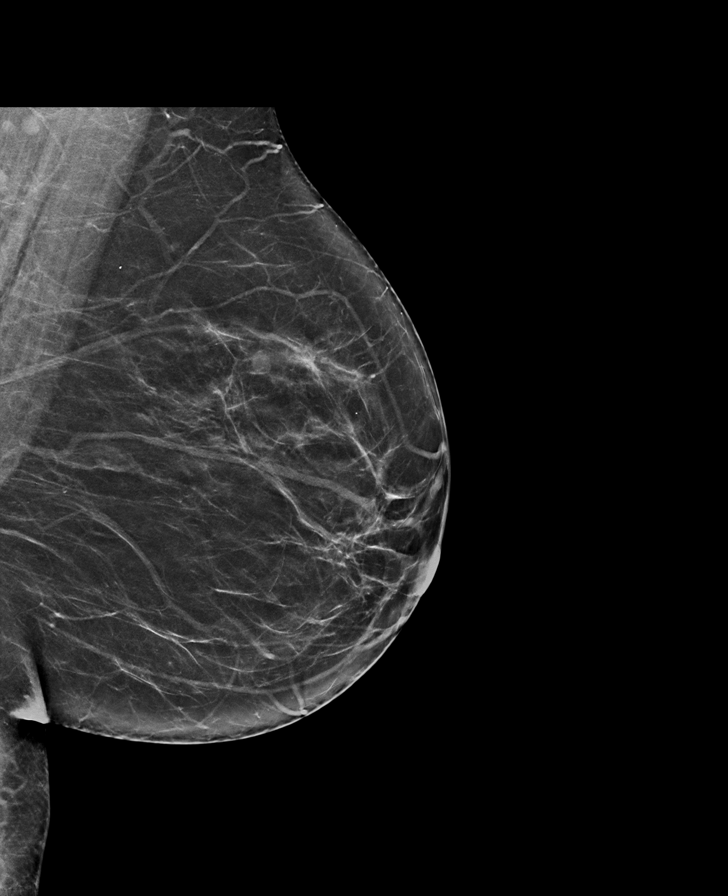

[R MLO synth-2D]
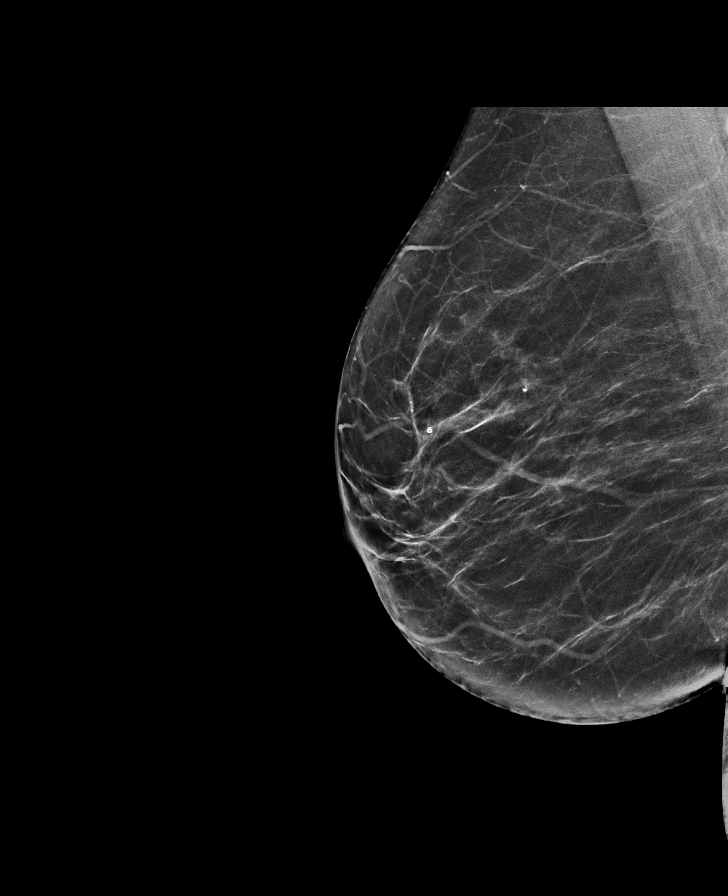

[L CC synth-2D]
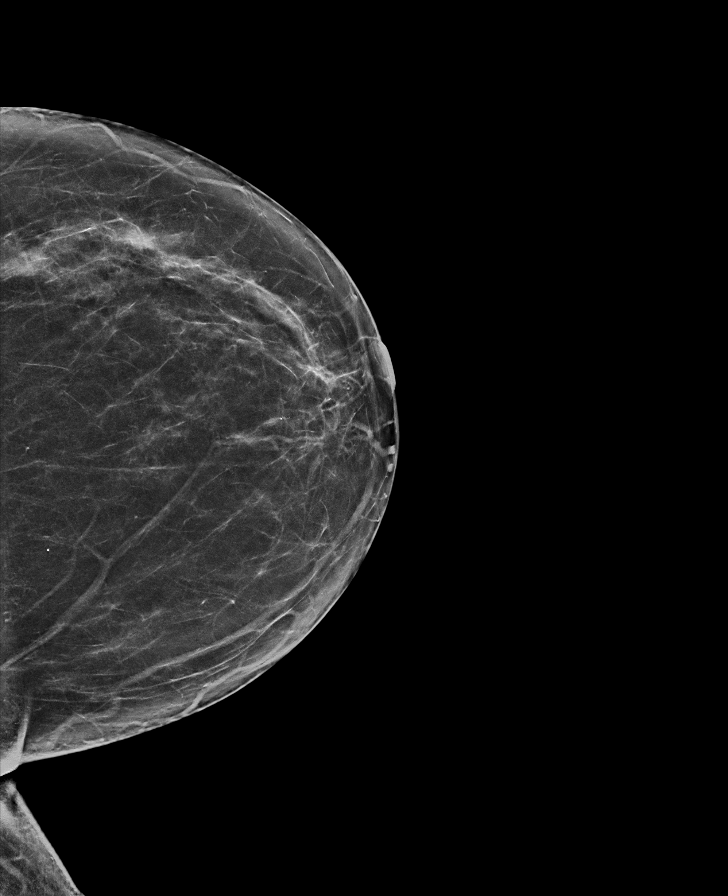

[R CC synth-2D]
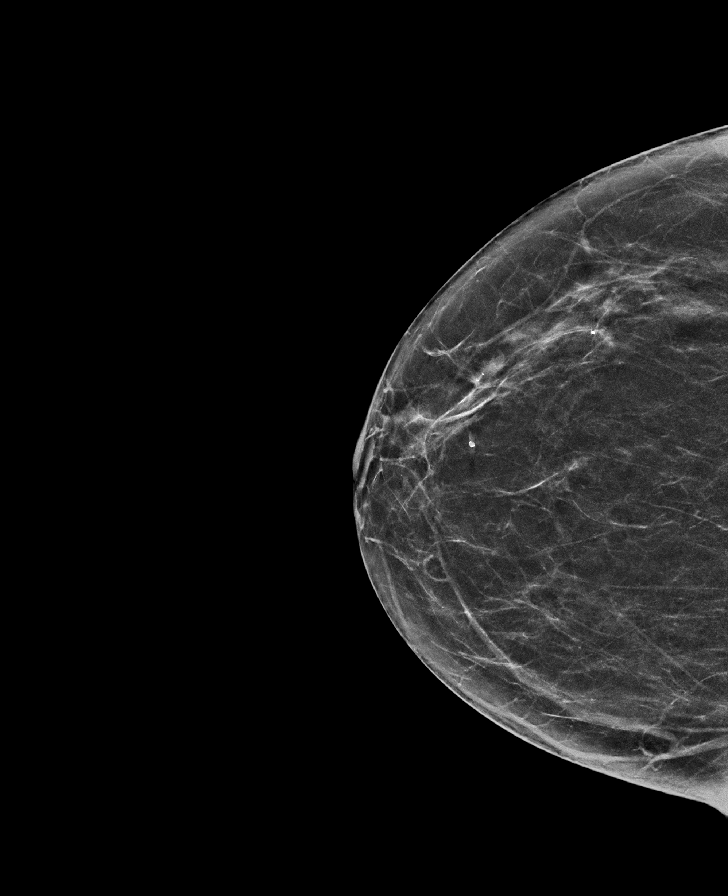

[L CC tomo · tomo slice 35/69.0]
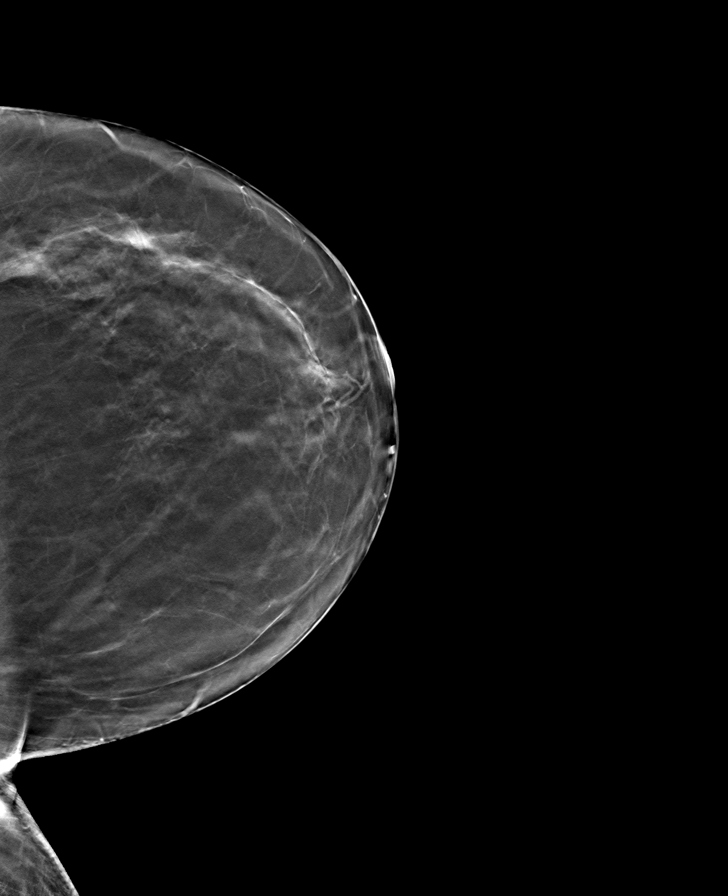

[R MLO tomo · tomo slice 37/72.0]
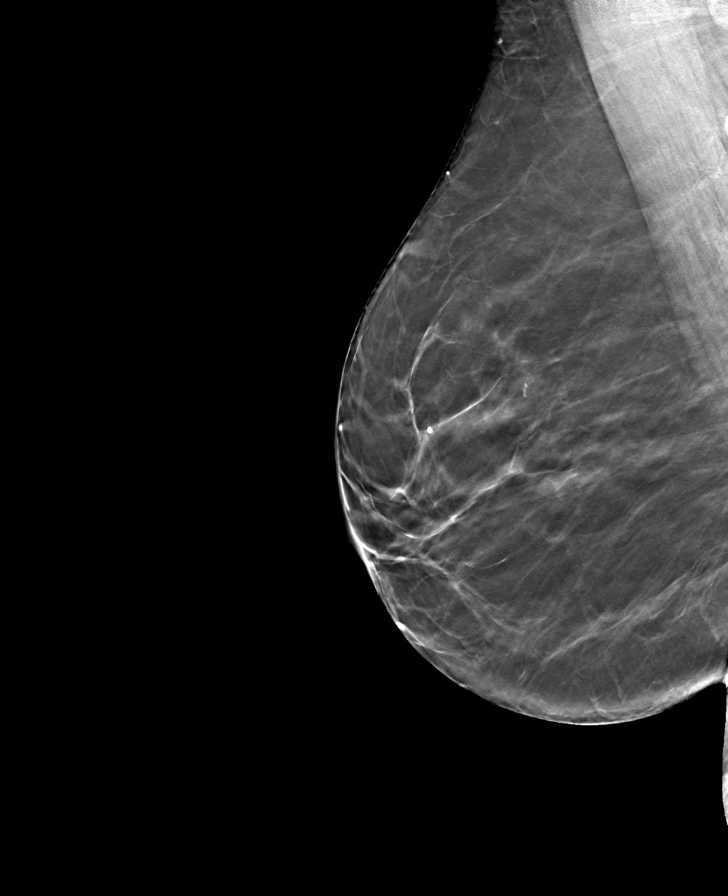

[L MLO tomo · tomo slice 37/72.0]
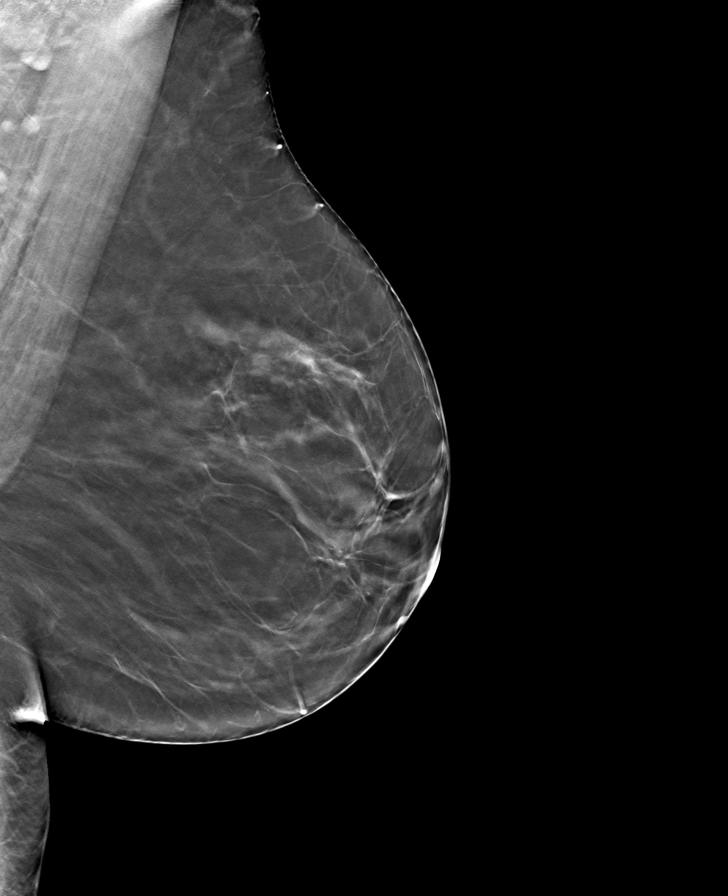

[R CC tomo · tomo slice 33/64.0]
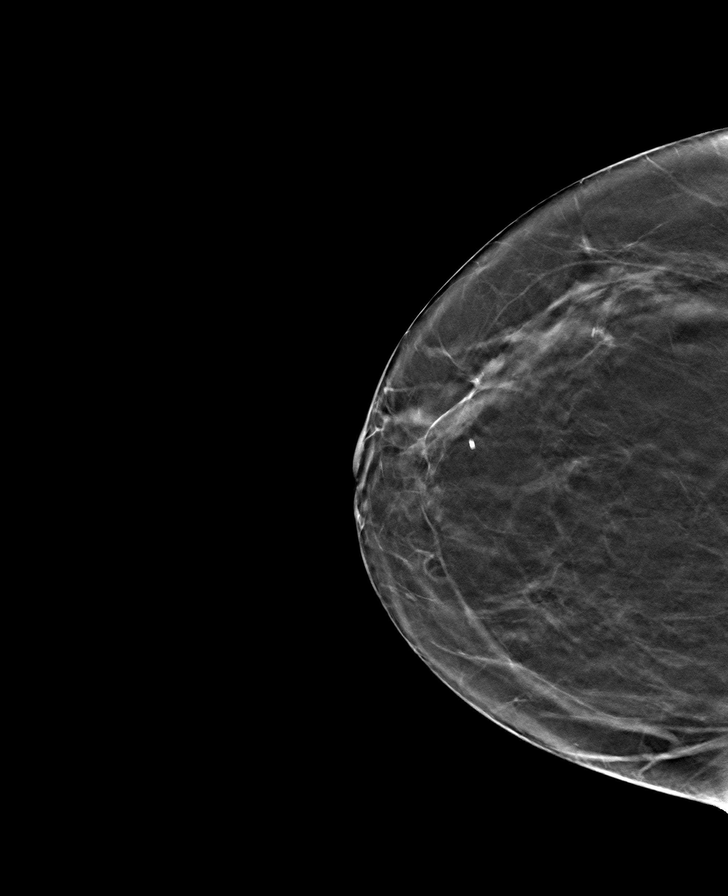

[8 of 24 positions shown; findings below may reference images not displayed]

ACR Breast Density Category b: There are scattered areas of
fibroglandular density.
FINDINGS: There are no findings suspicious for malignancy.
IMPRESSION: No mammographic evidence of malignancy. A result letter of this
screening mammogram will be mailed directly to the patient.

RECOMMENDATION:
Screening mammogram in one year. (Code:51-O-LD2)

BI-RADS CATEGORY  1: Negative.

## 2021-12-09 ENCOUNTER — Ambulatory Visit: Payer: Self-pay | Admitting: Physician Assistant

## 2021-12-09 ENCOUNTER — Encounter: Payer: Self-pay | Admitting: Physician Assistant

## 2021-12-09 DIAGNOSIS — F439 Reaction to severe stress, unspecified: Secondary | ICD-10-CM

## 2021-12-09 DIAGNOSIS — R490 Dysphonia: Secondary | ICD-10-CM

## 2021-12-09 DIAGNOSIS — F17200 Nicotine dependence, unspecified, uncomplicated: Secondary | ICD-10-CM

## 2021-12-09 DIAGNOSIS — F419 Anxiety disorder, unspecified: Secondary | ICD-10-CM

## 2021-12-09 NOTE — Progress Notes (Signed)
There were no vitals taken for this visit.   Subjective:    Patient ID: Denise Ford, female    DOB: 1961-03-28, 60 y.o.   MRN: [id number]  HPI: Denise Ford is a 60 y.o. female presenting on 12/09/2021 for No chief complaint on file.   HPI   This is a telemedicine appointment via Telephone since pt does not have a video enabled device.    I connected with  [name] on 12/09/21 by a video enabled telemedicine application and verified that I am speaking with the correct person using two identifiers.   I discussed the limitations of evaluation and management by telemedicine. The patient expressed understanding and agreed to proceed.  Pt is at home.  Provider is at office.    Pt appointment is to follow up mood.  She was recently started on Citalopram which she says is helping.   She also had appointment with [name] on 11/27/21.  She says she is Still feeling overwhelmed.  She says a Lot of that is due to numerous family parites for the holidays.  She says the citalopram is helping with her anxiety.  She isn't feeling depression  She got covid booster and flu shot  Pt requests to go to see ENT.  She says she saw ENT about 7 years or more ago.  She is worried that her hoarse voice is cancer.  She says she saw [name] who evaluated her for the hoarseness and then referred her to endocrinologist.  She continues to see endocrinologist annually.  The notes from her ENT visits are not available at this time.     Relevant past medical, surgical, family and social history reviewed and updated as indicated. Interim medical history since our last visit reviewed. Allergies and medications reviewed and updated.   Current Outpatient Medications:    atorvastatin (LIPITOR) 20 MG tablet, TAKE 1 Tablet BY MOUTH ONCE EVERY DAY, Disp: 90 tablet, Rfl: 1   citalopram (CELEXA) 20 MG tablet, Take 1 tablet (20 mg total) by mouth daily., Disp: 90 tablet, Rfl: 0    Review of Systems  Per  HPI unless specifically indicated above     Objective:    There were no vitals taken for this visit.  Wt Readings from Last 3 Encounters:  10/25/21 167 lb 12.8 oz (76.1 kg)  07/08/21 156 lb (70.8 kg)  03/18/21 152 lb (68.9 kg)    Physical Exam HENT:     Mouth/Throat:     Comments: Voice hoarse.  No change from previous Pulmonary:     Effort: No respiratory distress.     Comments: Pt is talking in complete sentences without dyspnea Neurological:     Mental Status: She is alert and oriented to person, place, and time.  Psychiatric:        Attention and Perception: Attention normal.        Speech: Speech normal.        Behavior: Behavior is cooperative.          Assessment & Plan:   Encounter Diagnoses  Name Primary?   Anxiety Yes   Stress    Tobacco use disorder    Hoarse voice quality        -pt to Continue citalopram -will refer to ENT- for hoarse voice.  Will have her sign record release to get records from [name] when she comes in for her appointment with [facility] on January 4.   -discussed with pt that  smoking worsens voice and recommended she stop smoking -She has [facility] appt january 4 -Pt to follow up here 6 wk with check lipids before appt.  She is to contact office sooner

## 2022-01-01 ENCOUNTER — Ambulatory Visit: Payer: Self-pay | Admitting: Licensed Clinical Social Worker

## 2022-01-03 ENCOUNTER — Other Ambulatory Visit: Payer: Self-pay | Admitting: Physician Assistant

## 2022-01-06 ENCOUNTER — Telehealth: Payer: Self-pay

## 2022-01-06 NOTE — Telephone Encounter (Signed)
Called [facility] to check for last delivery of pt Rx of Citalopram, pharmacist confirmed it was delivered 01/05/2022.

## 2022-01-09 ENCOUNTER — Other Ambulatory Visit: Payer: Self-pay | Admitting: Physician Assistant

## 2022-01-09 ENCOUNTER — Telehealth: Payer: Self-pay

## 2022-01-09 MED ORDER — CITALOPRAM HYDROBROMIDE 20 MG PO TABS: 20.0000 mg | ORAL_TABLET | Freq: Every day | ORAL | 0 refills | Status: DC

## 2022-01-09 NOTE — Telephone Encounter (Addendum)
Spoke with pt regarding medication, informed delivered already & if not received she would need to call the pharmacy to let them know.

## 2022-01-15 ENCOUNTER — Other Ambulatory Visit: Payer: Self-pay | Admitting: Physician Assistant

## 2022-01-15 ENCOUNTER — Ambulatory Visit: Payer: Self-pay | Admitting: Licensed Clinical Social Worker

## 2022-01-15 DIAGNOSIS — E785 Hyperlipidemia, unspecified: Secondary | ICD-10-CM

## 2022-01-23 ENCOUNTER — Encounter: Payer: Self-pay | Admitting: Otolaryngology

## 2022-01-23 ENCOUNTER — Other Ambulatory Visit: Payer: Self-pay | Admitting: Physician Assistant

## 2022-01-23 ENCOUNTER — Ambulatory Visit: Payer: Self-pay | Admitting: Otolaryngology

## 2022-01-23 ENCOUNTER — Other Ambulatory Visit (HOSPITAL_COMMUNITY)
Admission: RE | Admit: 2022-01-23 | Discharge: 2022-01-23 | Disposition: A | Discharge status: Home / Self Care | Payer: Self-pay | Source: Ambulatory Visit | Attending: Physician Assistant | Admitting: Physician Assistant

## 2022-01-23 VITALS — BP 132/87 | HR 77 | Temp 97.1°F | Wt 165.0 lb

## 2022-01-23 DIAGNOSIS — J381 Polyp of vocal cord and larynx: Secondary | ICD-10-CM

## 2022-01-23 DIAGNOSIS — E041 Nontoxic single thyroid nodule: Secondary | ICD-10-CM

## 2022-01-23 DIAGNOSIS — E785 Hyperlipidemia, unspecified: Secondary | ICD-10-CM | POA: Insufficient documentation

## 2022-01-23 DIAGNOSIS — R490 Dysphonia: Secondary | ICD-10-CM

## 2022-01-23 DIAGNOSIS — K219 Gastro-esophageal reflux disease without esophagitis: Secondary | ICD-10-CM

## 2022-01-23 DIAGNOSIS — F17210 Nicotine dependence, cigarettes, uncomplicated: Secondary | ICD-10-CM

## 2022-01-23 LAB — COMPREHENSIVE METABOLIC PANEL
ALT: 21 U/L (ref 0–44)
AST: 27 U/L (ref 15–41)
Albumin: 4.8 g/dL (ref 3.5–5.0)
Alkaline Phosphatase: 68 U/L (ref 38–126)
Anion gap: 12 (ref 5–15)
BUN: 15 mg/dL (ref 6–20)
CO2: 24 mmol/L (ref 22–32)
Calcium: 10 mg/dL (ref 8.9–10.3)
Chloride: 102 mmol/L (ref 98–111)
Creatinine, Ser: 0.74 mg/dL (ref 0.44–1.00)
GFR, Estimated: >60 mL/min (ref >60)
Glucose, Bld: 118 mg/dL — ABNORMAL HIGH (ref 70–99)
Potassium: 3.7 mmol/L (ref 3.5–5.1)
Sodium: 138 mmol/L (ref 135–145)
Total Bilirubin: 0.9 mg/dL (ref 0.3–1.2)
Total Protein: 8 g/dL (ref 6.5–8.1)

## 2022-01-23 LAB — LIPID PANEL
Cholesterol: 215 mg/dL — ABNORMAL HIGH (ref 0–200)
HDL: 74 mg/dL (ref >40)
LDL Cholesterol: 117 mg/dL — ABNORMAL HIGH (ref 0–99)
Total CHOL/HDL Ratio: 2.9 RATIO
Triglycerides: 119 mg/dL (ref <150)
VLDL: 24 mg/dL (ref 0–40)

## 2022-01-23 MED ORDER — OMEPRAZOLE 40 MG PO CPDR: 40.0000 mg | DELAYED_RELEASE_CAPSULE | Freq: Every day | ORAL | 3 refills | Status: DC

## 2022-01-23 NOTE — Patient Instructions (Signed)
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  These are probable from smoking and reflux.  I would like you on Prilosec once each evening before dinner.  Let me look at you again in two months.  I do not think these are cancer, but are bigger than when Dr. Benjamine Mola saw you in 2013.    I think your swallowing issues are also from reflux.   I would like to order a swallowing xray.  We will call you with the results.  Your thyroid blood test results are good.  The thyroid lump appear to be growing slowly which is not necessarily bad.  You  had a biopsy a couple of years ago which showed a benign lump.

## 2022-01-23 NOTE — Progress Notes (Signed)
CC:  globus sensation  Hx:  61 yo wf with long standing hx of globus symptoms.  Saw Dr. Benjamine Mola 10 yrs ago with symmetric vocal cord edema.  Reflux treatment with bid prilosec x 1 mo without obvious improvement in symptoms.  Claims to have had hoarseness x many years.  More concerned with swallowing issues.  No imaging.  TSH, Bx, U/S of thyroid show a slow growing benign nodule.  Was smoking 2 ppd, now down to 1 ppd cigarettes.    PE:  tired appearing middle aged wf.  Quite hoarse but no stridor or labor.  Mental status is sharp.  Head NCAT.  Ears cl.  Nose with LEFTward septal deviation.  OC with multiple missing teeth.  OC with 2+ tonsils.  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  No pooling in valleculae nor pyriforms.  Neck without adenopathy.  Small RIGHT thyroid nodule.    Imp:  chronic cigarette abuse.  Probable reflux laryngitis/esophagitis. Vocal cord polyps.   Thyroid nodule.  Plan:  Prilosec before dinner.  Stop smoking.  Ba Swallow.  Recheck here 2 mos.

## 2022-01-27 ENCOUNTER — Other Ambulatory Visit (HOSPITAL_COMMUNITY): Payer: Self-pay | Admitting: Otolaryngology

## 2022-01-27 ENCOUNTER — Ambulatory Visit: Payer: Self-pay | Admitting: Physician Assistant

## 2022-01-27 DIAGNOSIS — F17200 Nicotine dependence, unspecified, uncomplicated: Secondary | ICD-10-CM

## 2022-01-27 DIAGNOSIS — R1319 Other dysphagia: Secondary | ICD-10-CM

## 2022-01-27 DIAGNOSIS — F419 Anxiety disorder, unspecified: Secondary | ICD-10-CM

## 2022-01-27 DIAGNOSIS — R490 Dysphonia: Secondary | ICD-10-CM

## 2022-01-27 DIAGNOSIS — E785 Hyperlipidemia, unspecified: Secondary | ICD-10-CM

## 2022-01-27 MED ORDER — ATORVASTATIN CALCIUM 20 MG PO TABS: ORAL_TABLET | ORAL | 1 refills | Status: DC

## 2022-01-27 MED ORDER — CITALOPRAM HYDROBROMIDE 20 MG PO TABS: 20.0000 mg | ORAL_TABLET | Freq: Every day | ORAL | 0 refills | Status: DC

## 2022-01-27 MED ORDER — OMEPRAZOLE 40 MG PO CPDR: 40.0000 mg | DELAYED_RELEASE_CAPSULE | Freq: Every day | ORAL | 1 refills | Status: DC

## 2022-01-27 NOTE — Progress Notes (Signed)
There were no vitals taken for this visit.   Subjective:    Patient ID: Denise Ford, female    DOB: May 09, 1961, 61 y.o.   MRN: [id number]  HPI: Denise Ford is a 61 y.o. female presenting on 01/27/2022 for Hyperlipidemia   HPI   This is a telemedicine appointment.  It is via telephone as pt's mobile phone does't currently have any minutes so she is unable to connect through [employer] for video appointment.  I connected with  [name] on 01/27/22 by a video enabled telemedicine application and verified that I am speaking with the correct person using two identifiers.   I discussed the limitations of evaluation and management by telemedicine. The patient expressed understanding and agreed to proceed.  Pt is at home.  Provider is at office.        Pt is 61yoF with follow up for elevated cholesterol and anxiety.  She is currently seeing [facility] which she says is helpful.  She feels like the citalopram is helping her mood.  She denies SI, HI.   Pt was seen by ENT last week for evaluation of her hoarse voice.  Pt continues to smoke.     Relevant past medical, surgical, family and social history reviewed and updated as indicated. Interim medical history since our last visit reviewed. Allergies and medications reviewed and updated.   Current Outpatient Medications:    atorvastatin (LIPITOR) 20 MG tablet, TAKE 1 Tablet BY MOUTH ONCE EVERY DAY, Disp: 90 tablet, Rfl: 1   citalopram (CELEXA) 20 MG tablet, Take 1 tablet (20 mg total) by mouth daily., Disp: 90 tablet, Rfl: 0   omeprazole (PRILOSEC) 40 MG capsule, Take 1 capsule (40 mg total) by mouth daily. Before dinner, Disp: 90 capsule, Rfl: 1    Review of Systems  Per HPI unless specifically indicated above     Objective:    There were no vitals taken for this visit.  Wt Readings from Last 3 Encounters:  01/23/22 165 lb (74.8 kg)  10/25/21 167 lb 12.8 oz (76.1 kg)  07/08/21 156 lb (70.8 kg)    Physical Exam HENT:      Mouth/Throat:     Comments: Voice hoarse Pulmonary:     Effort: No respiratory distress.     Comments: Pt is talking in complete sentences without dypsnea.  Neurological:     Mental Status: She is alert and oriented to person, place, and time.  Psychiatric:        Attention and Perception: Attention normal.        Behavior: Behavior is cooperative.    Results for orders placed or performed during the hospital encounter of 01/23/22  Lipid panel  Result Value Ref Range   Cholesterol 215 (H) 0 - 200 mg/dL   Triglycerides 119 <150 mg/dL   HDL 74 >40 mg/dL   Total CHOL/HDL Ratio 2.9 RATIO   VLDL 24 0 - 40 mg/dL   LDL Cholesterol 117 (H) 0 - 99 mg/dL  Comprehensive metabolic panel  Result Value Ref Range   Sodium 138 135 - 145 mmol/L   Potassium 3.7 3.5 - 5.1 mmol/L   Chloride 102 98 - 111 mmol/L   CO2 24 22 - 32 mmol/L   Glucose, Bld 118 (H) 70 - 99 mg/dL   BUN 15 6 - 20 mg/dL   Creatinine, Ser 0.74 0.44 - 1.00 mg/dL   Calcium 10.0 8.9 - 10.3 mg/dL   Total Protein 8.0 6.5 - 8.1 g/dL  Albumin 4.8 3.5 - 5.0 g/dL   AST 27 15 - 41 U/L   ALT 21 0 - 44 U/L   Alkaline Phosphatase 68 38 - 126 U/L   Total Bilirubin 0.9 0.3 - 1.2 mg/dL   GFR, Estimated >60 >60 mL/min   Anion gap 12 5 - 15      Assessment & Plan:   Encounter Diagnoses  Name Primary?   Hyperlipidemia, unspecified hyperlipidemia type Yes   Anxiety    Hoarseness, chronic    Tobacco use disorder      -Reviewed labs with pt -She has cafa application?  Need bill?  Check with [employer] on her financial assistance.  After appointment, found out from [employer] that pt does not have current assistance due to she has no bill.  She will need to provide updated documents.  Pt was called and told this information -pt to Continue current rx -encouraged smoking cessation -pt to F/u with ENT per his recommendation.  Pt to follow up here  three months.  She is to contact office sooner prn

## 2022-01-28 ENCOUNTER — Encounter: Payer: Self-pay | Admitting: Physician Assistant

## 2022-01-28 ENCOUNTER — Telehealth: Payer: Self-pay | Admitting: Physician Assistant

## 2022-01-28 ENCOUNTER — Other Ambulatory Visit: Payer: Self-pay | Admitting: Physician Assistant

## 2022-01-28 NOTE — Telephone Encounter (Signed)
Pt is notified of appointment for Ba swallow ordered by dr Erik Obey.  She is to arrive at [facility] on Thursday 02/06/22 at 9:30am with NPO for 3 hours prior to test.  She states understanding and agreement

## 2022-02-06 ENCOUNTER — Ambulatory Visit (HOSPITAL_COMMUNITY): Payer: Self-pay

## 2022-02-12 ENCOUNTER — Ambulatory Visit (HOSPITAL_COMMUNITY)
Admission: RE | Admit: 2022-02-12 | Discharge: 2022-02-12 | Disposition: A | Discharge status: Home / Self Care | Payer: Self-pay | Source: Ambulatory Visit | Attending: Otolaryngology | Admitting: Otolaryngology

## 2022-02-12 ENCOUNTER — Other Ambulatory Visit: Payer: Self-pay

## 2022-02-12 DIAGNOSIS — K219 Gastro-esophageal reflux disease without esophagitis: Secondary | ICD-10-CM | POA: Insufficient documentation

## 2022-02-13 ENCOUNTER — Telehealth: Payer: Self-pay | Admitting: Physician Assistant

## 2022-02-13 ENCOUNTER — Telehealth: Payer: Self-pay

## 2022-02-13 DIAGNOSIS — K222 Esophageal obstruction: Secondary | ICD-10-CM

## 2022-02-13 DIAGNOSIS — R933 Abnormal findings on diagnostic imaging of other parts of digestive tract: Secondary | ICD-10-CM

## 2022-02-13 NOTE — Telephone Encounter (Signed)
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

## 2022-02-13 NOTE — Telephone Encounter (Signed)
Pt was called.  Discussed results barium swallow study ordered by Dr Erik Obey.  Recommended referral to GI and pt agrees.  She is still working on her application for financial assistance but says she will get that finished up.

## 2022-02-19 ENCOUNTER — Encounter: Payer: Self-pay | Admitting: Internal Medicine

## 2022-03-19 ENCOUNTER — Encounter: Payer: Self-pay | Admitting: Gastroenterology

## 2022-03-19 ENCOUNTER — Ambulatory Visit (INDEPENDENT_AMBULATORY_CARE_PROVIDER_SITE_OTHER): Payer: Self-pay | Admitting: Gastroenterology

## 2022-03-19 ENCOUNTER — Encounter: Payer: Self-pay | Admitting: *Deleted

## 2022-03-19 ENCOUNTER — Other Ambulatory Visit: Payer: Self-pay

## 2022-03-19 DIAGNOSIS — R1319 Other dysphagia: Secondary | ICD-10-CM

## 2022-03-19 DIAGNOSIS — K222 Esophageal obstruction: Secondary | ICD-10-CM

## 2022-03-19 DIAGNOSIS — R1310 Dysphagia, unspecified: Secondary | ICD-10-CM | POA: Insufficient documentation

## 2022-03-19 NOTE — H&P (View-Only) (Signed)
? ? ? ?GI Office Note   ? ?Referring Provider: Soyla Dryer, PA-C ?Primary Care Physician:  Soyla Dryer, PA-C  ?Primary Gastroenterologist: ? ?Chief Complaint  ? ?Chief Complaint  ?Patient presents with  ? Dysphagia  ?  Food gets hung up in her throat, ENT told her that she has polyps on her vocal cords as well.   ? ? ?History of Present Illness  ? ?Denise Ford is a 61 y.o. female presenting today at the request of Soyla Dryer, PA-C for esophageal stricture. ? ?Recently seen by Dr. Erik Obey for longstanding history of globus and hoarseness.  Had seen Dr. 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  She has been on twice daily Prilosec for 1 month without relief.  History of benign thyroid nodule with last ultrasound and biopsy in 2021.  Followed by Dr. Dorris Fetch.  History of chronic smoking, 2 packs/day but gradually decreased to 1 pack/day.  Recent mirror exam showed bilateral pedunculated erythematous vocal cord polyps without obstruction.  Suspected reflux laryngitis/esophagitis.  Patient was advised to take Prilosec before dinner, stop smoking.  Barium esophagram completed February 15 showing diffuse narrowing at the GE junction, additional narrowing at the cervical esophagus from a prominent cricopharyngeus muscle.  12.5 mm tablet obstructed at the GE junction and would not pass.  Dissolved every 10 to 15 minutes.  Tablet transiently obstructed at the prominent cricopharyngeus muscle.  Diffuse age-related dysmotility. ? ?Feels like something in the back of throat all the time. Chronic hoarseness. Food can get stuck in chest, can't get up or down.  Finally will pass but has had to vomit at times.  No heartburn. Dysphagia for years, but gradually worse. Now significant. Noticed at first when she was drinking a lot of etoh so just thought it was damage she had done to herself. Early satiety. BMs regular. No melena, brbpr. No weight loss.  ? ?Husband has lung cancer. Has done chemo,  immunotherapy. Being monitored for now.  ? ? ? ?Medications  ? ?Current Outpatient Medications  ?Medication Sig Dispense Refill  ? atorvastatin (LIPITOR) 20 MG tablet TAKE 1 Tablet BY MOUTH ONCE EVERY DAY 90 tablet 1  ? citalopram (CELEXA) 20 MG tablet Take 1 tablet (20 mg total) by mouth daily. 90 tablet 0  ? omeprazole (PRILOSEC) 40 MG capsule Take 1 capsule (40 mg total) by mouth daily. Before dinner 90 capsule 1  ? ?No current facility-administered medications for this visit.  ? ? ?Allergies  ? ?Allergies as of 03/19/2022  ? (No Known Allergies)  ? ? ?Past Medical History  ? ?Past Medical History:  ?Diagnosis Date  ? Anxiety   ? Hyperlipidemia   ? Vocal cord polyps   ? ? ?Past Surgical History  ? ?Past Surgical History:  ?Procedure Laterality Date  ? CESAREAN SECTION    ? x2  ? TUBAL LIGATION    ? ? ?Past Family History  ? ?Family History  ?Problem Relation Age of Onset  ? Hypertension Mother   ? COPD Mother   ? Cancer Mother   ?     thoat cancer  ? Hypertension Father   ? Cancer Paternal Aunt   ?     pancreatic cancer  ? Cancer Paternal Uncle   ? Colon cancer Neg Hx   ? ? ?Past Social History  ? ?Social History  ? ?Socioeconomic History  ? Marital status: Married  ?  Spouse name: Not on file  ? Number of children: Not  on file  ? Years of education: Not on file  ? Highest education level: Not on file  ?Occupational History  ? Not on file  ?Tobacco Use  ? Smoking status: Every Day  ?  Packs/day: 1.00  ?  Years: 30.00  ?  Pack years: 30.00  ?  Types: Cigarettes  ? Smokeless tobacco: Never  ?Vaping Use  ? Vaping Use: Never used  ?Substance and Sexual Activity  ? Alcohol use: Not Currently  ?  Comment: prior heavy etoh use  ? Drug use: Yes  ?  Types: Marijuana  ?  Comment: occ  ? Sexual activity: Not Currently  ?Other Topics Concern  ? Not on file  ?Social History Narrative  ? Not on file  ? ?Social Determinants of Health  ? ?Financial Resource Strain: Not on file  ?Food Insecurity: Not on file  ?Transportation  Needs: Not on file  ?Physical Activity: Not on file  ?Stress: Not on file  ?Social Connections: Not on file  ?Intimate Partner Violence: Not on file  ? ? ?Review of Systems  ? ?General: Negative for anorexia, weight loss, fever, chills, fatigue, weakness. ?Eyes: Negative for vision changes.  ?ENT: Negative for hoarseness, difficulty swallowing , nasal congestion. ?CV: Negative for chest pain, angina, palpitations, dyspnea on exertion, peripheral edema.  ?Respiratory: Negative for dyspnea at rest, dyspnea on exertion, cough, sputum, wheezing.  ?GI: See history of present illness. ?GU:  Negative for dysuria, hematuria, urinary incontinence, urinary frequency, nocturnal urination.  ?MS: Negative for joint pain, low back pain.  ?Derm: Negative for rash or itching.  ?Neuro: Negative for weakness, abnormal sensation, seizure, frequent headaches, memory loss,  ?confusion.  ?Psych: Negative for anxiety, depression, suicidal ideation, hallucinations.  ?Endo: Negative for unusual weight change.  ?Heme: Negative for bruising or bleeding. ?Allergy: Negative for rash or hives. ? ?Physical Exam  ? ?BP 120/62 (BP Location: Right Arm, Patient Position: Sitting, Cuff Size: Normal)   Pulse 87   Temp (!) 97.3 ?F (36.3 ?C) (Temporal)   Ht '5\' 4"'$  (1.626 m)   Wt 171 lb 6.4 oz (77.7 kg)   SpO2 98%   BMI 29.42 kg/m?  ?  ?General: Well-nourished, well-developed in no acute distress.  ?Head: Normocephalic, atraumatic.   ?Eyes: Conjunctiva pink, no icterus. ?Mouth: masked ?Neck: Supple without thyromegaly, masses, or lymphadenopathy.  ?Lungs: Clear to auscultation bilaterally.  ?Heart: Regular rate and rhythm, no murmurs rubs or gallops.  ?Abdomen: Bowel sounds are normal, nontender, nondistended, no hepatosplenomegaly or masses,  ?no abdominal bruits or hernia, no rebound or guarding.   ?Rectal: not performed ?Extremities: No lower extremity edema. No clubbing or deformities.  ?Neuro: Alert and oriented x 4 , grossly normal  neurologically.  ?Skin: Warm and dry, no rash or jaundice.   ?Psych: Alert and cooperative, normal mood and affect. ? ?Labs  ? ?Lab Results  ?Component Value Date  ? CREATININE 0.74 01/23/2022  ? BUN 15 01/23/2022  ? NA 138 01/23/2022  ? K 3.7 01/23/2022  ? CL 102 01/23/2022  ? CO2 24 01/23/2022  ? ?Lab Results  ?Component Value Date  ? ALT 21 01/23/2022  ? AST 27 01/23/2022  ? ALKPHOS 68 01/23/2022  ? BILITOT 0.9 01/23/2022  ? ?Lab Results  ?Component Value Date  ? TSH 2.239 10/22/2021  ? ? ?Imaging Studies  ? ?No results found. ? ?Assessment  ? ?Esophageal dysphagia: Chronic, progressive dysphagia with episodes of near impaction.  Recent barium esophagram ordered by ENT showed prominent cricopharyngeus muscle  narrowing the cervical esophagus, transiently obstructing passage of 12.5 mm barium tablet.  Stricture at the GE junction obstructing 12.5 mm barium tablet, never passed.  Patient is having problems with anything that she eats.  She has never had heartburn.  Sees ENT for chronic hoarseness and vocal cord polyps, she is suspected to have reflux laryngitis.  When her swallowing symptoms initially started years ago, she thought it was due to drinking too much alcohol.  States multiple people on her mother side of the family have had their esophagus stretched.  She may have a peptic stricture or esophageal ring, eosinophilic esophagitis and malignancy remains in the differential.  She describes early satiety but it is not clear if this is related to her dysphagia, will evaluate at time of EGD. ? ?Colon cancer screening: No prior colonoscopy.  She completed iFOBT July 2022 which was negative.  Would recommend first-ever screening colonoscopy once her esophageal dysphagia has been addressed. ? ? ?PLAN  ? ?EGD/ED with Dr. Gala Romney.  ASA 2.  I have discussed the risks, alternatives, benefits with regards to but not limited to the risk of reaction to medication, bleeding, infection, perforation and the patient is  agreeable to proceed. Written consent to be obtained. ?Continue omeprazole 40 mg daily before evening meal. ?After her swallowing issues have been addressed, we can bring her back for first-ever screening colonoscopy. ? ? ?Laureen Ochs. Vista Lawman

## 2022-03-19 NOTE — Progress Notes (Signed)
? ? ? ?GI Office Note   ? ?Referring Provider: Soyla Dryer, PA-C ?Primary Care Physician:  Soyla Dryer, PA-C  ?Primary Gastroenterologist: ? ?Chief Complaint  ? ?Chief Complaint  ?Patient presents with  ? Dysphagia  ?  Food gets hung up in her throat, ENT told her that she has polyps on her vocal cords as well.   ? ? ?History of Present Illness  ? ?Denise Ford is a 61 y.o. female presenting today at the request of Soyla Dryer, PA-C for esophageal stricture. ? ?Recently seen by Dr. Erik Obey for longstanding history of globus and hoarseness.  Had seen Dr. 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  She has been on twice daily Prilosec for 1 month without relief.  History of benign thyroid nodule with last ultrasound and biopsy in 2021.  Followed by Dr. Dorris Fetch.  History of chronic smoking, 2 packs/day but gradually decreased to 1 pack/day.  Recent mirror exam showed bilateral pedunculated erythematous vocal cord polyps without obstruction.  Suspected reflux laryngitis/esophagitis.  Patient was advised to take Prilosec before dinner, stop smoking.  Barium esophagram completed February 15 showing diffuse narrowing at the GE junction, additional narrowing at the cervical esophagus from a prominent cricopharyngeus muscle.  12.5 mm tablet obstructed at the GE junction and would not pass.  Dissolved every 10 to 15 minutes.  Tablet transiently obstructed at the prominent cricopharyngeus muscle.  Diffuse age-related dysmotility. ? ?Feels like something in the back of throat all the time. Chronic hoarseness. Food can get stuck in chest, can't get up or down.  Finally will pass but has had to vomit at times.  No heartburn. Dysphagia for years, but gradually worse. Now significant. Noticed at first when she was drinking a lot of etoh so just thought it was damage she had done to herself. Early satiety. BMs regular. No melena, brbpr. No weight loss.  ? ?Husband has lung cancer. Has done chemo,  immunotherapy. Being monitored for now.  ? ? ? ?Medications  ? ?Current Outpatient Medications  ?Medication Sig Dispense Refill  ? atorvastatin (LIPITOR) 20 MG tablet TAKE 1 Tablet BY MOUTH ONCE EVERY DAY 90 tablet 1  ? citalopram (CELEXA) 20 MG tablet Take 1 tablet (20 mg total) by mouth daily. 90 tablet 0  ? omeprazole (PRILOSEC) 40 MG capsule Take 1 capsule (40 mg total) by mouth daily. Before dinner 90 capsule 1  ? ?No current facility-administered medications for this visit.  ? ? ?Allergies  ? ?Allergies as of 03/19/2022  ? (No Known Allergies)  ? ? ?Past Medical History  ? ?Past Medical History:  ?Diagnosis Date  ? Anxiety   ? Hyperlipidemia   ? Vocal cord polyps   ? ? ?Past Surgical History  ? ?Past Surgical History:  ?Procedure Laterality Date  ? CESAREAN SECTION    ? x2  ? TUBAL LIGATION    ? ? ?Past Family History  ? ?Family History  ?Problem Relation Age of Onset  ? Hypertension Mother   ? COPD Mother   ? Cancer Mother   ?     thoat cancer  ? Hypertension Father   ? Cancer Paternal Aunt   ?     pancreatic cancer  ? Cancer Paternal Uncle   ? Colon cancer Neg Hx   ? ? ?Past Social History  ? ?Social History  ? ?Socioeconomic History  ? Marital status: Married  ?  Spouse name: Not on file  ? Number of children: Not  on file  ? Years of education: Not on file  ? Highest education level: Not on file  ?Occupational History  ? Not on file  ?Tobacco Use  ? Smoking status: Every Day  ?  Packs/day: 1.00  ?  Years: 30.00  ?  Pack years: 30.00  ?  Types: Cigarettes  ? Smokeless tobacco: Never  ?Vaping Use  ? Vaping Use: Never used  ?Substance and Sexual Activity  ? Alcohol use: Not Currently  ?  Comment: prior heavy etoh use  ? Drug use: Yes  ?  Types: Marijuana  ?  Comment: occ  ? Sexual activity: Not Currently  ?Other Topics Concern  ? Not on file  ?Social History Narrative  ? Not on file  ? ?Social Determinants of Health  ? ?Financial Resource Strain: Not on file  ?Food Insecurity: Not on file  ?Transportation  Needs: Not on file  ?Physical Activity: Not on file  ?Stress: Not on file  ?Social Connections: Not on file  ?Intimate Partner Violence: Not on file  ? ? ?Review of Systems  ? ?General: Negative for anorexia, weight loss, fever, chills, fatigue, weakness. ?Eyes: Negative for vision changes.  ?ENT: Negative for hoarseness, difficulty swallowing , nasal congestion. ?CV: Negative for chest pain, angina, palpitations, dyspnea on exertion, peripheral edema.  ?Respiratory: Negative for dyspnea at rest, dyspnea on exertion, cough, sputum, wheezing.  ?GI: See history of present illness. ?GU:  Negative for dysuria, hematuria, urinary incontinence, urinary frequency, nocturnal urination.  ?MS: Negative for joint pain, low back pain.  ?Derm: Negative for rash or itching.  ?Neuro: Negative for weakness, abnormal sensation, seizure, frequent headaches, memory loss,  ?confusion.  ?Psych: Negative for anxiety, depression, suicidal ideation, hallucinations.  ?Endo: Negative for unusual weight change.  ?Heme: Negative for bruising or bleeding. ?Allergy: Negative for rash or hives. ? ?Physical Exam  ? ?BP 120/62 (BP Location: Right Arm, Patient Position: Sitting, Cuff Size: Normal)   Pulse 87   Temp (!) 97.3 ?F (36.3 ?C) (Temporal)   Ht '5\' 4"'$  (1.626 m)   Wt 171 lb 6.4 oz (77.7 kg)   SpO2 98%   BMI 29.42 kg/m?  ?  ?General: Well-nourished, well-developed in no acute distress.  ?Head: Normocephalic, atraumatic.   ?Eyes: Conjunctiva pink, no icterus. ?Mouth: masked ?Neck: Supple without thyromegaly, masses, or lymphadenopathy.  ?Lungs: Clear to auscultation bilaterally.  ?Heart: Regular rate and rhythm, no murmurs rubs or gallops.  ?Abdomen: Bowel sounds are normal, nontender, nondistended, no hepatosplenomegaly or masses,  ?no abdominal bruits or hernia, no rebound or guarding.   ?Rectal: not performed ?Extremities: No lower extremity edema. No clubbing or deformities.  ?Neuro: Alert and oriented x 4 , grossly normal  neurologically.  ?Skin: Warm and dry, no rash or jaundice.   ?Psych: Alert and cooperative, normal mood and affect. ? ?Labs  ? ?Lab Results  ?Component Value Date  ? CREATININE 0.74 01/23/2022  ? BUN 15 01/23/2022  ? NA 138 01/23/2022  ? K 3.7 01/23/2022  ? CL 102 01/23/2022  ? CO2 24 01/23/2022  ? ?Lab Results  ?Component Value Date  ? ALT 21 01/23/2022  ? AST 27 01/23/2022  ? ALKPHOS 68 01/23/2022  ? BILITOT 0.9 01/23/2022  ? ?Lab Results  ?Component Value Date  ? TSH 2.239 10/22/2021  ? ? ?Imaging Studies  ? ?No results found. ? ?Assessment  ? ?Esophageal dysphagia: Chronic, progressive dysphagia with episodes of near impaction.  Recent barium esophagram ordered by ENT showed prominent cricopharyngeus muscle  narrowing the cervical esophagus, transiently obstructing passage of 12.5 mm barium tablet.  Stricture at the GE junction obstructing 12.5 mm barium tablet, never passed.  Patient is having problems with anything that she eats.  She has never had heartburn.  Sees ENT for chronic hoarseness and vocal cord polyps, she is suspected to have reflux laryngitis.  When her swallowing symptoms initially started years ago, she thought it was due to drinking too much alcohol.  States multiple people on her mother side of the family have had their esophagus stretched.  She may have a peptic stricture or esophageal ring, eosinophilic esophagitis and malignancy remains in the differential.  She describes early satiety but it is not clear if this is related to her dysphagia, will evaluate at time of EGD. ? ?Colon cancer screening: No prior colonoscopy.  She completed iFOBT July 2022 which was negative.  Would recommend first-ever screening colonoscopy once her esophageal dysphagia has been addressed. ? ? ?PLAN  ? ?EGD/ED with Dr. Gala Romney.  ASA 2.  I have discussed the risks, alternatives, benefits with regards to but not limited to the risk of reaction to medication, bleeding, infection, perforation and the patient is  agreeable to proceed. Written consent to be obtained. ?Continue omeprazole 40 mg daily before evening meal. ?After her swallowing issues have been addressed, we can bring her back for first-ever screening colonoscopy. ? ? ?Laureen Ochs. Vista Lawman

## 2022-03-19 NOTE — Patient Instructions (Addendum)
Continue omeprazole 40 mg daily, 30 to 60 mg before evening meal. ?Upper endoscopy to be scheduled.  See separate instructions. ?Once we have addressed your swallowing issues, would recommend coming back for a colonoscopy. ?

## 2022-03-24 ENCOUNTER — Other Ambulatory Visit: Payer: Self-pay

## 2022-03-24 ENCOUNTER — Ambulatory Visit (HOSPITAL_BASED_OUTPATIENT_CLINIC_OR_DEPARTMENT_OTHER): Payer: Self-pay | Admitting: Certified Registered Nurse Anesthetist

## 2022-03-24 ENCOUNTER — Encounter (HOSPITAL_COMMUNITY)
Admission: RE | Disposition: A | Discharge status: Home / Self Care | Payer: Self-pay | Source: Home / Self Care | Attending: Internal Medicine

## 2022-03-24 ENCOUNTER — Encounter (HOSPITAL_COMMUNITY): Payer: Self-pay | Admitting: Internal Medicine

## 2022-03-24 ENCOUNTER — Ambulatory Visit (HOSPITAL_COMMUNITY): Payer: Self-pay | Admitting: Certified Registered Nurse Anesthetist

## 2022-03-24 ENCOUNTER — Ambulatory Visit (HOSPITAL_COMMUNITY)
Admission: RE | Admit: 2022-03-24 | Discharge: 2022-03-24 | Disposition: A | Discharge status: Home / Self Care | Payer: Self-pay | Attending: Internal Medicine | Admitting: Internal Medicine

## 2022-03-24 DIAGNOSIS — Z79899 Other long term (current) drug therapy: Secondary | ICD-10-CM | POA: Insufficient documentation

## 2022-03-24 DIAGNOSIS — K222 Esophageal obstruction: Secondary | ICD-10-CM | POA: Insufficient documentation

## 2022-03-24 DIAGNOSIS — F17210 Nicotine dependence, cigarettes, uncomplicated: Secondary | ICD-10-CM | POA: Insufficient documentation

## 2022-03-24 DIAGNOSIS — R1310 Dysphagia, unspecified: Secondary | ICD-10-CM | POA: Insufficient documentation

## 2022-03-24 HISTORY — PX: MALONEY DILATION: SHX5535

## 2022-03-24 HISTORY — PX: ESOPHAGOGASTRODUODENOSCOPY (EGD) WITH PROPOFOL: SHX5813

## 2022-03-24 SURGERY — ESOPHAGOGASTRODUODENOSCOPY (EGD) WITH PROPOFOL
Anesthesia: General

## 2022-03-24 MED ORDER — PROPOFOL 500 MG/50ML IV EMUL
INTRAVENOUS | Status: DC | PRN
  Administered 2022-03-24: 180 ug/kg/min via INTRAVENOUS

## 2022-03-24 MED ORDER — LACTATED RINGERS IV SOLN: INTRAVENOUS | Status: DC | PRN

## 2022-03-24 MED ORDER — SODIUM CHLORIDE FLUSH 0.9 % IV SOLN
INTRAVENOUS | Status: AC
Start: 2022-03-24 — End: ?
  Filled 2022-03-24: qty 20

## 2022-03-24 MED ORDER — PROPOFOL 10 MG/ML IV BOLUS
INTRAVENOUS | Status: DC | PRN
  Administered 2022-03-24: 100 mg via INTRAVENOUS
  Administered 2022-03-24: 50 mg via INTRAVENOUS

## 2022-03-24 MED ORDER — PROPOFOL 10 MG/ML IV BOLUS
INTRAVENOUS | Status: AC
  Filled 2022-03-24: qty 20

## 2022-03-24 NOTE — Anesthesia Preprocedure Evaluation (Signed)
Anesthesia Evaluation  ?Patient identified by MRN, date of birth, ID band ?Patient awake ? ? ? ?Reviewed: ?Allergy & Precautions, H&P , NPO status , Patient's Chart, lab work & pertinent test results, reviewed documented beta blocker date and time  ? ?Airway ?Mallampati: II ? ?TM Distance: >3 FB ?Neck ROM: full ? ? ? Dental ?no notable dental hx. ? ?  ?Pulmonary ?neg pulmonary ROS, Current Smoker,  ?  ?Pulmonary exam normal ?breath sounds clear to auscultation ? ? ? ? ? ? Cardiovascular ?Exercise Tolerance: Good ?negative cardio ROS ? ? ?Rhythm:regular Rate:Normal ? ? ?  ?Neuro/Psych ?Anxiety negative neurological ROS ? negative psych ROS  ? GI/Hepatic ?negative GI ROS, Neg liver ROS,   ?Endo/Other  ?negative endocrine ROS ? Renal/GU ?negative Renal ROS  ?negative genitourinary ?  ?Musculoskeletal ? ? Abdominal ?  ?Peds ? Hematology ?negative hematology ROS ?(+)   ?Anesthesia Other Findings ? ? Reproductive/Obstetrics ?negative OB ROS ? ?  ? ? ? ? ? ? ? ? ? ? ? ? ? ?  ?  ? ? ? ? ? ? ? ? ?Anesthesia Physical ?Anesthesia Plan ? ?ASA: 2 ? ?Anesthesia Plan: General  ? ?Post-op Pain Management:   ? ?Induction:  ? ?PONV Risk Score and Plan: Propofol infusion ? ?Airway Management Planned:  ? ?Additional Equipment:  ? ?Intra-op Plan:  ? ?Post-operative Plan:  ? ?Informed Consent: I have reviewed the patients History and Physical, chart, labs and discussed the procedure including the risks, benefits and alternatives for the proposed anesthesia with the patient or authorized representative who has indicated his/her understanding and acceptance.  ? ? ? ?Dental Advisory Given ? ?Plan Discussed with: CRNA ? ?Anesthesia Plan Comments:   ? ? ? ? ? ? ?Anesthesia Quick Evaluation ? ?

## 2022-03-24 NOTE — Transfer of Care (Signed)
Immediate Anesthesia Transfer of Care Note ? ?Patient: Denise Ford ? ?Procedure(s) Performed: ESOPHAGOGASTRODUODENOSCOPY (EGD) WITH PROPOFOL ?MALONEY DILATION ? ?Patient Location: PACU ? ?Anesthesia Type:General ? ?Level of Consciousness: awake, alert  and oriented ? ?Airway & Oxygen Therapy: Patient Spontanous Breathing ? ?Post-op Assessment: Report given to RN, Post -op Vital signs reviewed and stable, Patient moving all extremities X 4 and Patient able to stick tongue midline ? ?Post vital signs: Reviewed ? ?Last Vitals:  ?Vitals Value Taken Time  ?BP 122/83   ?Temp 36.0   ?Pulse 60   ?Resp 18   ?SpO2 96   ? ? ?Last Pain:  ?Vitals:  ? 03/24/22 1129  ?TempSrc:   ?PainSc: 0-No pain  ?   ? ?Patients Stated Pain Goal: 7 (03/24/22 1029) ? ?Complications: No notable events documented. ?

## 2022-03-24 NOTE — Interval H&P Note (Signed)
History and Physical Interval Note: ? ?03/24/2022 ?11:25 AM ? ?Denise Ford  has presented today for surgery, with the diagnosis of dysphagia, esophageal stricture.  The various methods of treatment have been discussed with the patient and family. After consideration of risks, benefits and other options for treatment, the patient has consented to  Procedure(s) with comments: ?ESOPHAGOGASTRODUODENOSCOPY (EGD) WITH PROPOFOL (N/A) - 1:30pm ?MALONEY DILATION (N/A) as a surgical intervention.  The patient's history has been reviewed, patient examined, no change in status, stable for surgery.  I have reviewed the patient's chart and labs.  Questions were answered to the patient's satisfaction.   ? ? ?Herbie Baltimore Travia Onstad ? ?No change.  Agree with need for EGD.  Have offered the patient EGD with esophageal dilation as feasible/appropriate per plan today.  Patient notes that she does not often remember to take her mid omeprazole but cannot tell any difference 1 way or the other with the use of omeprazole.  Her questions been answered she is agreeable. ?The risks, benefits, limitations, alternatives and imponderables have been reviewed with the patient. Potential for esophageal dilation, biopsy, etc. have also been reviewed.   ?Further recommendations to follow. ?

## 2022-03-24 NOTE — Discharge Instructions (Addendum)
EGD ?Discharge instructions ?Please read the instructions outlined below and refer to this sheet in the next few weeks. These discharge instructions provide you with general information on caring for yourself after you leave the hospital. Your doctor may also give you specific instructions. While your treatment has been planned according to the most current medical practices available, unavoidable complications occasionally occur. If you have any problems or questions after discharge, please call your doctor. ?ACTIVITY ?You may resume your regular activity but move at a slower pace for the next 24 hours.  ?Take frequent rest periods for the next 24 hours.  ?Walking will help expel (get rid of) the air and reduce the bloated feeling in your abdomen.  ?No driving for 24 hours (because of the anesthesia (medicine) used during the test).  ?You may shower.  ?Do not sign any important legal documents or operate any machinery for 24 hours (because of the anesthesia used during the test).  ?NUTRITION ?Drink plenty of fluids.  ?You may resume your normal diet.  ?Begin with a light meal and progress to your normal diet.  ?Avoid alcoholic beverages for 24 hours or as instructed by your caregiver.  ?MEDICATIONS ?You may resume your normal medications unless your caregiver tells you otherwise.  ?WHAT YOU CAN EXPECT TODAY ?You may experience abdominal discomfort such as a feeling of fullness or ?gas? pains.  ?FOLLOW-UP ?Your doctor will discuss the results of your test with you.  ?SEEK IMMEDIATE MEDICAL ATTENTION IF ANY OF THE FOLLOWING OCCUR: ?Excessive nausea (feeling sick to your stomach) and/or vomiting.  ?Severe abdominal pain and distention (swelling).  ?Trouble swallowing.  ?Your esophagus was found to be narrow today.  It was stretched. ? ?Biopsies were taken ? ?Continue omeprazole 40 mg every day please take it 30 minutes before breakfast.  Take it every day or the feel you needed or not ? ?Further recommendations to  follow pending review of pathology report ? ?Office visit with Neil Crouch in 3 months- Wednesday June 28th @ 3pm ? ?At patient request, I called Charlean Sanfilippo at [phone] -reviewed findings and recommendations ?Temperature over 101? F (37.8? C).  ?Rectal bleeding or vomiting of blood.   ?

## 2022-03-25 ENCOUNTER — Encounter: Payer: Self-pay | Admitting: Internal Medicine

## 2022-03-25 LAB — SURGICAL PATHOLOGY

## 2022-03-25 NOTE — Anesthesia Postprocedure Evaluation (Signed)
Anesthesia Post Note ? ?Patient: Denise Ford ? ?Procedure(s) Performed: ESOPHAGOGASTRODUODENOSCOPY (EGD) WITH PROPOFOL ?MALONEY DILATION ? ?Patient location during evaluation: Phase II ?Anesthesia Type: General ?Level of consciousness: awake ?Pain management: pain level controlled ?Vital Signs Assessment: post-procedure vital signs reviewed and stable ?Respiratory status: spontaneous breathing and respiratory function stable ?Cardiovascular status: blood pressure returned to baseline and stable ?Postop Assessment: no headache and no apparent nausea or vomiting ?Anesthetic complications: no ?Comments: Late entry ? ? ?No notable events documented. ? ? ?Last Vitals:  ?Vitals:  ? 03/24/22 1029 03/24/22 1145  ?BP: (!) 128/93 122/83  ?Pulse:  84  ?Resp: 20 18  ?Temp: 36.8 ?C 36.4 ?C  ?SpO2: 98% 97%  ?  ?Last Pain:  ?Vitals:  ? 03/24/22 1145  ?TempSrc: Oral  ?PainSc: 0-No pain  ? ? ?  ?  ?  ?  ?  ?  ? ?Louann Sjogren ? ? ? ? ?

## 2022-04-02 ENCOUNTER — Other Ambulatory Visit: Payer: Self-pay | Admitting: Physician Assistant

## 2022-04-03 ENCOUNTER — Encounter: Payer: Self-pay | Admitting: Otolaryngology

## 2022-04-03 ENCOUNTER — Ambulatory Visit: Payer: Self-pay | Admitting: Otolaryngology

## 2022-04-03 VITALS — BP 108/70 | HR 59 | Temp 97.8°F | Wt 161.0 lb

## 2022-04-03 DIAGNOSIS — K222 Esophageal obstruction: Secondary | ICD-10-CM

## 2022-04-03 DIAGNOSIS — R933 Abnormal findings on diagnostic imaging of other parts of digestive tract: Secondary | ICD-10-CM

## 2022-04-03 DIAGNOSIS — K219 Gastro-esophageal reflux disease without esophagitis: Secondary | ICD-10-CM

## 2022-04-03 DIAGNOSIS — F17200 Nicotine dependence, unspecified, uncomplicated: Secondary | ICD-10-CM

## 2022-04-03 NOTE — Patient Instructions (Signed)
Since I saw you last, you had a swallowing x-ray which showed narrowing just behind the voicebox, and also where the swallowing tube enters the stomach.  Dr. Gala Romney did an endoscopy and stretched the tight areas.  A biopsy was clear.  You have been taking your Prilosec before dinner.  You have been successfully cutting down on your smoking.  Otherwise, you are basically the same as last time.  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  If these begin to cause difficulty with speaking or breathing, we could remove them.  We would need to have your reflux under excellent control before doing this. ? ?For now, I would like to wait a few more weeks to see if Dr. Orvan Falconer procedure helped.  Keep y needed.  Our appointment with him please. ? ?Continue to work on your smoking.  The nicotine patches or gum are less likely to cause cancer long-term. ? ?I would like you to take your Prilosec twice daily before breakfast and before dinner and let Dr. Sydell Axon know if this is any better. ? ?Return visit here with me as needed. ?

## 2022-04-03 NOTE — Progress Notes (Signed)
Chief complaint: Dysphagia ? ?History: 73-monthreturn visit.  Barium swallow showed some cricopharyngeal achalasia, and some diffuse narrowing at the GE junction.  She has seen Dr. RGala Romneyand had an upper GI endoscopy.  He performed dilation of the GE junction.  She does not really feel like she has much better since then.  She is due to see him again next month. ? ?Down to 1/2 pack/day smoking.  She is using Prilosec once daily before dinner.  He remains hoarse but no breathing difficulty.  Slight discomfort, no hemoptysis.  She has gained some weight. ? ?Examination: She is overweight.  Mental status is sharp.  Her voice is quite raspy but no stridor.  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  She has a few lower teeth in fair to poor repair.  Oropharynx is clear.  Neck without adenopathy. ? ?Impression: Cricopharyngeal achalasia.  Distal esophageal narrowing. ? ?Plan: I would have her take Prilosec twice daily before breakfast and dinner.  She will continue to work down her smoking.  I would have her follow-up with Dr. RGala Romneyas scheduled.  If her voice gets worse, or she has any difficulty breathing, we may consider for removal of her vocal cord polyps.  Return visit with me as needed. ?

## 2022-04-16 NOTE — Op Note (Signed)
[facility] ?Patient Name: Denise Ford ?Procedure Date: 03/24/2022 11:16 AM ?MRN: [id number] ?Date of Birth: March 14, 1961 ?Attending MD: Norvel Richards , MD ?CSN: [id number] ?Age: 61 ?Admit Type: Outpatient ?Procedure:                Upper GI endoscopy ?Indications:              Dysphagia ?Providers:                Norvel Richards, MD, Charlsie Quest Theda Sers RN, Therapist, sports,  ?                          Randa Spike, Technician ?Referring MD:              ?Medicines:                Propofol per Anesthesia ?Complications:            No immediate complications. ?Estimated Blood Loss:     Estimated blood loss was minimal. ?Procedure:                After obtaining informed consent, the endoscope was  ?                          passed under direct vision. Throughout the  ?                          procedure, the patient's blood pressure, pulse, and  ?                          oxygen saturations were monitored continuously. The  ?                          GIF-H190 ([id number]) scope was introduced through the  ?                          mouth, and advanced to the second part of duodenum. ?Scope In: 11:34:03 AM ?Scope Out: 11:41:16 AM ?Total Procedure Duration: 0 hours 7 minutes 13 seconds  ?Findings: ?     Minimal ring / stricure formation at the GE junction. Tubular esophagus  ?     patent throughout its course. No Barrett's epithelium or tumor seen.  ?     Stomach empty. Normal-appearing gastric mucosa. Patent pylorus.  ?     Examination the first and second portion of the duodenum revealed no  ?     abnormalities ?     Scope was withdrawn and a 20 French Maloney dilator was passed to full  ?     insertion easily. A look back revealed no apparent complication.  ?     Subsequently, biopsies of the mid and distal esophagus were taken for  ?     histologic study. ?Impression:               Subtle EG junction ring / stricture - tatus post  ?                          dilation and esophageal biopsy as described  above. ?Moderate Sedation: ?     Moderate (conscious) sedation was personally administered by an  ?  anesthesia professional. The following parameters were monitored: oxygen  ?     saturation, heart rate, blood pressure, respiratory rate, EKG, adequacy  ?     of pulmonary ventilation, and response to care. ?Recommendation:           - Patient has a contact number available for  ?                          emergencies. The signs and symptoms of potential  ?                          delayed complications were discussed with the  ?                          patient. Return to normal activities tomorrow.  ?                          Written discharge instructions were provided to the  ?                          patient. ?                          - Advance diet as tolerated. ?                          - Continue present medications. ?                          - Return to my office in 3 months. F/U on pathology. ?Procedure Code(s):        --- Professional --- ?                          [phone], Esophagogastroduodenoscopy, flexible,  ?                          transoral; diagnostic, including collection of  ?                          specimen(s) by brushing or washing, when performed  ?                          (separate procedure) ?Diagnosis Code(s):        --- Professional --- ?                          R13.10, Dysphagia, unspecified ?CPT copyright 2019 [employer]. All rights reserved. ?The codes documented in this report are preliminary and upon coder review may  ?be revised to meet current compliance requirements. ?[name]. Rudolfo Brandow, MD ?Norvel Richards, MD ?04/16/2022 12:34:58 PM ?This report has been signed electronically. ?Number of Addenda: 0 ?

## 2022-04-17 ENCOUNTER — Encounter (HOSPITAL_COMMUNITY): Payer: Self-pay | Admitting: Internal Medicine

## 2022-04-21 ENCOUNTER — Other Ambulatory Visit: Payer: Self-pay | Admitting: Physician Assistant

## 2022-04-21 DIAGNOSIS — E785 Hyperlipidemia, unspecified: Secondary | ICD-10-CM

## 2022-04-25 ENCOUNTER — Other Ambulatory Visit (HOSPITAL_COMMUNITY)
Admission: RE | Admit: 2022-04-25 | Discharge: 2022-04-25 | Disposition: A | Discharge status: Home / Self Care | Payer: Self-pay | Source: Ambulatory Visit | Attending: Physician Assistant | Admitting: Physician Assistant

## 2022-04-25 DIAGNOSIS — E785 Hyperlipidemia, unspecified: Secondary | ICD-10-CM | POA: Insufficient documentation

## 2022-04-25 LAB — HEPATIC FUNCTION PANEL
ALT: 15 U/L (ref 0–44)
AST: 16 U/L (ref 15–41)
Albumin: 4.1 g/dL (ref 3.5–5.0)
Alkaline Phosphatase: 64 U/L (ref 38–126)
Bilirubin, Direct: 0.1 mg/dL (ref 0.0–0.2)
Indirect Bilirubin: 0.7 mg/dL (ref 0.3–0.9)
Total Bilirubin: 0.8 mg/dL (ref 0.3–1.2)
Total Protein: 6.8 g/dL (ref 6.5–8.1)

## 2022-04-25 LAB — LIPID PANEL
Cholesterol: 174 mg/dL (ref 0–200)
HDL: 60 mg/dL (ref >40)
LDL Cholesterol: 93 mg/dL (ref 0–99)
Total CHOL/HDL Ratio: 2.9 RATIO
Triglycerides: 106 mg/dL (ref <150)
VLDL: 21 mg/dL (ref 0–40)

## 2022-04-29 ENCOUNTER — Encounter: Payer: Self-pay | Admitting: Physician Assistant

## 2022-04-29 ENCOUNTER — Ambulatory Visit: Payer: Self-pay | Admitting: Physician Assistant

## 2022-04-29 VITALS — BP 130/72 | HR 72 | Temp 97.8°F | Wt 163.4 lb

## 2022-04-29 DIAGNOSIS — F419 Anxiety disorder, unspecified: Secondary | ICD-10-CM

## 2022-04-29 DIAGNOSIS — E785 Hyperlipidemia, unspecified: Secondary | ICD-10-CM

## 2022-04-29 DIAGNOSIS — F17200 Nicotine dependence, unspecified, uncomplicated: Secondary | ICD-10-CM

## 2022-04-29 NOTE — Progress Notes (Signed)
? ?BP 130/72   Pulse 72   Temp 97.8 ?F (36.6 ?C)   Wt 163 lb 6.4 oz (74.1 kg)   SpO2 97%   BMI 28.05 kg/m?   ? ?Subjective:  ? ? Patient ID: Denise Ford, female    DOB: [birth year], 61 y.o.   MRN: [id number] ? ?HPI: ?Denise Ford is a 61 y.o. female presenting on 04/29/2022 for Hyperlipidemia ? ? ?HPI ? ? ?Chief Complaint  ?Patient presents with  ? Hyperlipidemia  ? ? ?Pt says The citalopram is making her mind too busy and thus interferes with her sleep.   She wants to stop taking it. ? ?She continues to care for her husband who has lung cancer.  This is stressful but she says she is doing okay with it.  ? ? ? ?Relevant past medical, surgical, family and social history reviewed and updated as indicated. Interim medical history since our last visit reviewed. ?Allergies and medications reviewed and updated. ? ? ?Current Outpatient Medications:  ?  atorvastatin (LIPITOR) 20 MG tablet, TAKE 1 Tablet BY MOUTH ONCE EVERY DAY, Disp: 90 tablet, Rfl: 1 ?  citalopram (CELEXA) 20 MG tablet, TAKE 1 Tablet BY MOUTH ONCE DAILY, Disp: 90 tablet, Rfl: 0 ?  Naproxen Sod-diphenhydrAMINE (ALEVE PM) 220-25 MG TABS, Take 1 tablet by mouth at bedtime as needed (sleep/pain.)., Disp: , Rfl:  ?  omeprazole (PRILOSEC) 40 MG capsule, Take 1 capsule (40 mg total) by mouth daily. Before dinner (Patient taking differently: Take 40 mg by mouth daily. 1 cap bid), Disp: 90 capsule, Rfl: 1 ? ? ? ?Review of Systems ? ?Per HPI unless specifically indicated above ? ?   ?Objective:  ?  ?BP 130/72   Pulse 72   Temp 97.8 ?F (36.6 ?C)   Wt 163 lb 6.4 oz (74.1 kg)   SpO2 97%   BMI 28.05 kg/m?   ?Wt Readings from Last 3 Encounters:  ?04/29/22 163 lb 6.4 oz (74.1 kg)  ?04/03/22 161 lb (73 kg)  ?03/24/22 170 lb (77.1 kg)  ?  ?Physical Exam ?Vitals reviewed.  ?Constitutional:   ?   General: She is not in acute distress. ?   Appearance: She is well-developed. She is not toxic-appearing.  ?HENT:  ?   Head: Normocephalic and atraumatic.   ?Cardiovascular:  ?   Rate and Rhythm: Normal rate and regular rhythm.  ?Pulmonary:  ?   Effort: Pulmonary effort is normal.  ?   Breath sounds: Normal breath sounds.  ?Abdominal:  ?   General: Bowel sounds are normal.  ?   Palpations: Abdomen is soft. There is no mass.  ?   Tenderness: There is no abdominal tenderness.  ?Musculoskeletal:  ?   Cervical back: Neck supple.  ?   Right lower leg: No edema.  ?   Left lower leg: No edema.  ?Lymphadenopathy:  ?   Cervical: No cervical adenopathy.  ?Skin: ?   General: Skin is warm and dry.  ?Neurological:  ?   Mental Status: She is alert and oriented to person, place, and time.  ?Psychiatric:     ?   Attention and Perception: Attention normal.     ?   Mood and Affect: Affect is not inappropriate.     ?   Speech: Speech normal.     ?   Behavior: Behavior normal. Behavior is cooperative.  ? ? ?Results for orders placed or performed during the hospital encounter of 04/25/22  ?Hepatic function panel  ?Result  Value Ref Range  ? Total Protein 6.8 6.5 - 8.1 g/dL  ? Albumin 4.1 3.5 - 5.0 g/dL  ? AST 16 15 - 41 U/L  ? ALT 15 0 - 44 U/L  ? Alkaline Phosphatase 64 38 - 126 U/L  ? Total Bilirubin 0.8 0.3 - 1.2 mg/dL  ? Bilirubin, Direct 0.1 0.0 - 0.2 mg/dL  ? Indirect Bilirubin 0.7 0.3 - 0.9 mg/dL  ?Lipid panel  ?Result Value Ref Range  ? Cholesterol 174 0 - 200 mg/dL  ? Triglycerides 106 <150 mg/dL  ? HDL 60 >40 mg/dL  ? Total CHOL/HDL Ratio 2.9 RATIO  ? VLDL 21 0 - 40 mg/dL  ? LDL Cholesterol 93 0 - 99 mg/dL  ? ?   ?Assessment & Plan:  ? ?Encounter Diagnoses  ?Name Primary?  ? Hyperlipidemia, unspecified hyperlipidemia type Yes  ? Tobacco use disorder   ? Anxiety   ? ? ? ?-reviewed labs with pt.  She will continue her atorvastatin ?-She wants to stop the citalopram and try nothing for now.  She does not want to reschedule with [facility]/counselor.  She says she will call if she changes her mind. ?-encouraged smoking cessation ?-pt to follow up 3 months.  She is to contact office sooner  prn ? ? ?

## 2022-05-12 ENCOUNTER — Telehealth: Payer: Self-pay | Admitting: Physician Assistant

## 2022-05-12 NOTE — Telephone Encounter (Signed)
Pt called requesting follow up with ENT because she is having worsening breathing and she says the ENT told her to follow up if her breathing worsens because she has vocal cord polyps.  Pt says the breathing worsened since last Wednesday.  She says "it's not an emergency or anything; I just called because he told me to."   She has had no change in her cough and she is not feeling sick or febrile.  She does not have an inhaler and has never used an inhaler.  She continues to smoke.  Pt was offered appointment to be seen in office today but she declined.  She says the earliest she can come in is next Monday.  Appointment was scheduled and she was encouraged to contact office sooner or go to ER for worsening before her appt.  ?

## 2022-05-19 ENCOUNTER — Ambulatory Visit: Payer: Self-pay | Admitting: Physician Assistant

## 2022-05-19 ENCOUNTER — Encounter: Payer: Self-pay | Admitting: Physician Assistant

## 2022-05-19 VITALS — BP 108/73 | HR 73 | Temp 96.8°F

## 2022-05-19 DIAGNOSIS — F17200 Nicotine dependence, unspecified, uncomplicated: Secondary | ICD-10-CM

## 2022-05-19 DIAGNOSIS — J449 Chronic obstructive pulmonary disease, unspecified: Secondary | ICD-10-CM

## 2022-05-19 DIAGNOSIS — F419 Anxiety disorder, unspecified: Secondary | ICD-10-CM

## 2022-05-19 MED ORDER — ALBUTEROL SULFATE HFA 108 (90 BASE) MCG/ACT IN AERS: 2.0000 | INHALATION_SPRAY | Freq: Four times a day (QID) | RESPIRATORY_TRACT | 0 refills | Status: DC | PRN

## 2022-05-19 MED ORDER — CITALOPRAM HYDROBROMIDE 20 MG PO TABS
20.0000 mg | ORAL_TABLET | Freq: Every day | ORAL | 0 refills | Status: DC
Start: 2022-05-19 — End: 2022-09-15

## 2022-05-19 MED ORDER — OMEPRAZOLE 40 MG PO CPDR: 40.0000 mg | DELAYED_RELEASE_CAPSULE | Freq: Every day | ORAL | 0 refills | Status: DC

## 2022-05-19 MED ORDER — FLUTICASONE-SALMETEROL 100-50 MCG/ACT IN AEPB: 1.0000 | INHALATION_SPRAY | Freq: Two times a day (BID) | RESPIRATORY_TRACT | 0 refills | Status: DC

## 2022-05-19 MED ORDER — ATORVASTATIN CALCIUM 20 MG PO TABS: ORAL_TABLET | ORAL | 0 refills | Status: DC

## 2022-05-19 NOTE — Progress Notes (Signed)
There were no vitals taken for this visit.   Subjective:    Patient ID: Denise Ford, female    DOB: July 21, 1961, 61 y.o.   MRN: [id number]  HPI: Denise Ford is a 61 y.o. female presenting on 05/19/2022 for No chief complaint on file.   HPI   Pt is 60yoF who presents today for sob  Pt says she re-started her citalopram.  She had quit taking it but she then started crying all the time so she started taking it again.  She is doing better now.  She denies SI, HI.   She says she got her cafa and renewed her medassist.  She says her breathing is worse.   She continues to smoke.  She is not interested in smoking cessation at this time.  She says she has never used an inhaler before.      Relevant past medical, surgical, family and social history reviewed and updated as indicated. Interim medical history since our last visit reviewed. Allergies and medications reviewed and updated.    Current Outpatient Medications:    atorvastatin (LIPITOR) 20 MG tablet, TAKE 1 Tablet BY MOUTH ONCE EVERY DAY, Disp: 90 tablet, Rfl: 1   citalopram (CELEXA) 20 MG tablet, Take 20 mg by mouth daily., Disp: , Rfl:    Naproxen Sod-diphenhydrAMINE (ALEVE PM) 220-25 MG TABS, Take 1 tablet by mouth at bedtime as needed (sleep/pain.)., Disp: , Rfl:    omeprazole (PRILOSEC) 40 MG capsule, Take 1 capsule (40 mg total) by mouth daily. Before dinner (Patient taking differently: Take 40 mg by mouth daily. 1 cap bid), Disp: 90 capsule, Rfl: 1     Review of Systems  Per HPI unless specifically indicated above     Objective:    There were no vitals taken for this visit.  Wt Readings from Last 3 Encounters:  04/29/22 163 lb 6.4 oz (74.1 kg)  04/03/22 161 lb (73 kg)  03/24/22 170 lb (77.1 kg)    Physical Exam Vitals reviewed.  Constitutional:      General: She is not in acute distress.    Appearance: She is well-developed. She is not toxic-appearing.  HENT:     Head: Normocephalic and  atraumatic.  Cardiovascular:     Rate and Rhythm: Normal rate and regular rhythm.  Pulmonary:     Effort: Pulmonary effort is normal.     Breath sounds: Normal breath sounds.  Abdominal:     General: Bowel sounds are normal.     Palpations: Abdomen is soft. There is no mass.     Tenderness: There is no abdominal tenderness.  Musculoskeletal:     Cervical back: Neck supple.     Right lower leg: No edema.     Left lower leg: No edema.  Lymphadenopathy:     Cervical: No cervical adenopathy.  Skin:    General: Skin is warm and dry.  Neurological:     Mental Status: She is alert and oriented to person, place, and time.  Psychiatric:        Behavior: Behavior normal.           Assessment & Plan:    Encounter Diagnoses  Name Primary?   Tobacco use disorder Yes   Chronic obstructive pulmonary disease, unspecified COPD type ([place])    Anxiety         -Add inhalers.  Counseled pt on using wixela every day and albuterol prn.  She was taught how to use both devices  She is not interested in smoking cessation at this time -she will continue the citalopram.  She is encouraged to re-schedule with [facility] -pt to follow up 6 weeks as scheduled.  She is to contact office sooner prn

## 2022-05-28 ENCOUNTER — Encounter: Payer: Self-pay | Admitting: Physician Assistant

## 2022-05-28 ENCOUNTER — Ambulatory Visit: Payer: Self-pay | Admitting: Physician Assistant

## 2022-05-28 VITALS — BP 130/90 | HR 73 | Temp 97.0°F | Wt 159.0 lb

## 2022-05-28 DIAGNOSIS — F17200 Nicotine dependence, unspecified, uncomplicated: Secondary | ICD-10-CM

## 2022-05-28 DIAGNOSIS — R251 Tremor, unspecified: Secondary | ICD-10-CM

## 2022-05-28 DIAGNOSIS — J449 Chronic obstructive pulmonary disease, unspecified: Secondary | ICD-10-CM

## 2022-05-28 NOTE — Progress Notes (Signed)
BP 130/90   Pulse 73   Temp (!) 97 F (36.1 C)   Wt 159 lb (72.1 kg)   SpO2 96%   BMI 27.29 kg/m    Subjective:    Patient ID: Denise Ford, female    DOB: 13-Dec-1961, 61 y.o.   MRN: [id number]  HPI: Denise Ford is a 61 y.o. female presenting on 05/28/2022 for Shaking   HPI  Chief Complaint  Patient presents with   Shaking      When she uses her wixela inhaler she feels tight in chest on the right and her hands shake.  Sometimes the hands shake for all day.  It has come down because she hasn't used the wixela today.    She has only used the albuterol mdi one time.    Sometimes she gets stressed but not like she was several months ago.  She has not had vision changes, difficulty with ambulation, LOC, or unilateral weakness.    She continues to smoke but says that she is trying to smoke less.     Relevant past medical, surgical, family and social history reviewed and updated as indicated. Interim medical history since our last visit reviewed. Allergies and medications reviewed and updated.   Current Outpatient Medications:    albuterol (VENTOLIN HFA) 108 (90 Base) MCG/ACT inhaler, Inhale 2 puffs into the lungs every 6 (six) hours as needed for wheezing or shortness of breath., Disp: 3 each, Rfl: 0   atorvastatin (LIPITOR) 20 MG tablet, TAKE 1 Tablet BY MOUTH ONCE EVERY DAY, Disp: 90 tablet, Rfl: 0   citalopram (CELEXA) 20 MG tablet, Take 1 tablet (20 mg total) by mouth daily., Disp: 90 tablet, Rfl: 0   diphenhydrAMINE-APAP, sleep, (GOODYS PM PO), Take by mouth., Disp: , Rfl:    fluticasone-salmeterol (WIXELA INHUB) 100-50 MCG/ACT AEPB, Inhale 1 puff into the lungs 2 (two) times daily., Disp: 1 each, Rfl: 0   omeprazole (PRILOSEC) 40 MG capsule, Take 1 capsule (40 mg total) by mouth daily. 1 cap bid, Disp: 180 capsule, Rfl: 0     Review of Systems  Per HPI unless specifically indicated above     Objective:    BP 130/90   Pulse 73   Temp (!) 97 F  (36.1 C)   Wt 159 lb (72.1 kg)   SpO2 96%   BMI 27.29 kg/m   Wt Readings from Last 3 Encounters:  05/28/22 159 lb (72.1 kg)  04/29/22 163 lb 6.4 oz (74.1 kg)  04/03/22 161 lb (73 kg)    Physical Exam Vitals reviewed.  Constitutional:      General: She is not in acute distress.    Appearance: She is well-developed. She is not toxic-appearing.  HENT:     Head: Normocephalic and atraumatic.  Eyes:     Extraocular Movements: Extraocular movements intact.     Conjunctiva/sclera: Conjunctivae normal.     Pupils: Pupils are equal, round, and reactive to light.  Cardiovascular:     Rate and Rhythm: Normal rate and regular rhythm.  Pulmonary:     Effort: Pulmonary effort is normal.     Breath sounds: Normal breath sounds.  Abdominal:     General: Bowel sounds are normal.     Palpations: Abdomen is soft. There is no mass.     Tenderness: There is no abdominal tenderness.  Musculoskeletal:     Cervical back: Neck supple.     Right lower leg: No edema.     Left  lower leg: No edema.  Lymphadenopathy:     Cervical: No cervical adenopathy.  Skin:    General: Skin is warm and dry.  Neurological:     Mental Status: She is alert and oriented to person, place, and time.     Cranial Nerves: No facial asymmetry.     Motor: Tremor present. No weakness.     Coordination: Coordination is intact.     Gait: Gait is intact.     Deep Tendon Reflexes: Reflexes normal.     Reflex Scores:      Patellar reflexes are 2+ on the right side and 2+ on the left side.    Comments: There is slight tremor/shaking of both hands  Psychiatric:        Behavior: Behavior normal.          Assessment & Plan:    Encounter Diagnoses  Name Primary?   Chronic obstructive pulmonary disease, unspecified COPD type ([place]) Yes   Tobacco use disorder    Shaking      -Hold wixela due to possible side effects -She is to use the albuterol prn -She is encouraged to limit smoking -she declines to reschedule  with counselor -She will follow up as scheduled.  She is urged to contact office right away if she has more symptoms or other issues

## 2022-05-28 NOTE — Patient Instructions (Signed)
Stop wixela Continue albuterol inhaler as needed

## 2022-06-25 ENCOUNTER — Encounter: Payer: Self-pay | Admitting: Gastroenterology

## 2022-06-25 ENCOUNTER — Ambulatory Visit (INDEPENDENT_AMBULATORY_CARE_PROVIDER_SITE_OTHER): Payer: Self-pay | Admitting: Gastroenterology

## 2022-06-25 ENCOUNTER — Encounter: Payer: Self-pay | Admitting: *Deleted

## 2022-06-25 VITALS — BP 111/74 | HR 86 | Temp 97.8°F | Ht 64.0 in | Wt 159.2 lb

## 2022-06-25 DIAGNOSIS — Z1211 Encounter for screening for malignant neoplasm of colon: Secondary | ICD-10-CM

## 2022-06-25 DIAGNOSIS — K219 Gastro-esophageal reflux disease without esophagitis: Secondary | ICD-10-CM | POA: Insufficient documentation

## 2022-06-25 DIAGNOSIS — K222 Esophageal obstruction: Secondary | ICD-10-CM

## 2022-06-25 DIAGNOSIS — K2100 Gastro-esophageal reflux disease with esophagitis, without bleeding: Secondary | ICD-10-CM

## 2022-06-25 MED ORDER — PEG 3350-KCL-NA BICARB-NACL 420 G PO SOLR: ORAL | 0 refills | Status: DC

## 2022-06-25 NOTE — Progress Notes (Signed)
GI Office Note    Referring Provider: Soyla Dryer, PA-C Primary Care Physician:  Soyla Dryer, PA-C  Primary Gastroenterologist: Garfield Cornea, MD   Chief Complaint   Chief Complaint  Patient presents with   Follow-up    Doing better since procedure.     History of Present Illness   Denise Ford is a 61 y.o. female presenting today for follow-up.  She was seen in the office back in March for esophageal stricture. Followed by ENT for years.  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  Recently seen by Dr. Erik Obey for longstanding globus and hoarseness.  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  Suspected reflux laryngitis/esophagitis.  Barium esophagram with diffuse narrowing at the GE junction, additional narrowing of the cervical esophagus from a prominent cricopharyngeus muscle, 12.5 mm tablet transiently obstructed at this location, then obstructed at GEJ, resolved after 10 to 15 minutes. History of benign thyroid nodule and biopsy in 2021.  Followed by Dr. Dorris Fetch.  Patient has never had a colonoscopy.  Since last ov, she had EGD as outlined below. States her swallowing is much better. No longer having pressure in lower chest/epigastric region. No heartburn, vomiting. ENT recommended increasing her omeprazole '40mg'$  twice daily. BM regular. No melena, brbpr.    EGD April 2023: -Subtle EG junction ring / stricture - tatus post dilation and esophageal biopsy as described above. -Reactive squamous mucosa, negative for eosinophils.  No dysplasia.  Findings most consistent with mild inflammation with acid reflux.   Medications   Current Outpatient Medications  Medication Sig Dispense Refill   albuterol (VENTOLIN HFA) 108 (90 Base) MCG/ACT inhaler Inhale 2 puffs into the lungs every 6 (six) hours as needed for wheezing or shortness of breath. 3 each 0   atorvastatin (LIPITOR) 20 MG tablet TAKE 1 Tablet  BY MOUTH ONCE EVERY DAY 90 tablet 0   citalopram (CELEXA) 20 MG tablet Take 1 tablet (20 mg total) by mouth daily. 90 tablet 0   diphenhydrAMINE-APAP, sleep, (GOODYS PM PO) Take by mouth.     omeprazole (PRILOSEC) 40 MG capsule Take 1 capsule (40 mg total) by mouth daily. 1 cap bid 180 capsule 0   No current facility-administered medications for this visit.    Allergies   Allergies as of 06/25/2022   (No Known Allergies)     Past Medical History   Past Medical History:  Diagnosis Date   Anxiety    Hyperlipidemia    Vocal cord polyps     Past Surgical History   Past Surgical History:  Procedure Laterality Date   CESAREAN SECTION     x2   ESOPHAGOGASTRODUODENOSCOPY (EGD) WITH PROPOFOL N/A 03/24/2022   Procedure: ESOPHAGOGASTRODUODENOSCOPY (EGD) WITH PROPOFOL;  Surgeon: Daneil Dolin, MD;  [facility];  Service: Endoscopy;  Laterality: N/A;  1:30pm   MALONEY DILATION N/A 03/24/2022   Procedure: Venia Minks DILATION;  Surgeon: Daneil Dolin, MD;  Location: [facility];  Service: Endoscopy;  Laterality: N/A;   TUBAL LIGATION      Past Family History   Family History  Problem Relation Age of Onset   Hypertension Mother    COPD Mother    Cancer Mother        thoat cancer   Hypertension Father    Cancer Paternal Aunt        pancreatic cancer   Cancer Paternal Uncle    Colon cancer Neg  Hx     Past Social History   Social History   Socioeconomic History   Marital status: Married    Spouse name: Not on file   Number of children: Not on file   Years of education: Not on file   Highest education level: Not on file  Occupational History   Not on file  Tobacco Use   Smoking status: Every Day    Packs/day: 0.50    Years: 30.00    Total pack years: 15.00    Types: Cigarettes   Smokeless tobacco: Never  Vaping Use   Vaping Use: Never used  Substance and Sexual Activity   Alcohol use: Not Currently    Comment: prior heavy etoh use/ seldom   Drug  use: Yes    Frequency: 2.0 times per week    Types: Marijuana    Comment: occ; last used 03/23/22   Sexual activity: Not Currently  Other Topics Concern   Not on file  Social History Narrative   Not on file   Social Determinants of Health   Financial Resource Strain: Not on file  Food Insecurity: Not on file  Transportation Needs: Not on file  Physical Activity: Not on file  Stress: Not on file  Social Connections: Not on file  Intimate Partner Violence: Not on file    Review of Systems   General: Negative for anorexia, weight loss, fever, chills, fatigue, weakness. ENT: Negative for hoarseness, difficulty swallowing , nasal congestion. CV: Negative for chest pain, angina, palpitations, dyspnea on exertion, peripheral edema.  Respiratory: Negative for dyspnea at rest, dyspnea on exertion, cough, sputum, wheezing.  GI: See history of present illness. GU:  Negative for dysuria, hematuria, urinary incontinence, urinary frequency, nocturnal urination.  Endo: Negative for unusual weight change.     Physical Exam   BP 111/74 (BP Location: Left Arm, Patient Position: Sitting, Cuff Size: Normal)   Pulse 86   Temp 97.8 F (36.6 C) (Temporal)   Ht '5\' 4"'$  (1.626 m)   Wt 159 lb 3.2 oz (72.2 kg)   SpO2 96%   BMI 27.33 kg/m    General: Well-nourished, well-developed in no acute distress.  Eyes: No icterus. Mouth: Oropharyngeal mucosa moist and pink , no lesions erythema or exudate. Lungs: Clear to auscultation bilaterally.  Heart: Regular rate and rhythm, no murmurs rubs or gallops.  Abdomen: Bowel sounds are normal, nontender, nondistended, no hepatosplenomegaly or masses,  no abdominal bruits or hernia , no rebound or guarding.  Rectal: not performed  Extremities: No lower extremity edema. No clubbing or deformities. Neuro: Alert and oriented x 4   Skin: Warm and dry, no jaundice.   Psych: Alert and cooperative, normal mood and affect.  Labs   Lab Results  Component Value  Date   CREATININE 0.74 01/23/2022   BUN 15 01/23/2022   NA 138 01/23/2022   K 3.7 01/23/2022   CL 102 01/23/2022   CO2 24 01/23/2022   Lab Results  Component Value Date   ALT 15 04/25/2022   AST 16 04/25/2022   ALKPHOS 64 04/25/2022   BILITOT 0.8 04/25/2022      Imaging Studies   No results found.  Assessment   GERD/esophageal dysphagia/stricture: Doing very well status post EGD with dilation.  Typical reflux well controlled.  Continues to follow with ENT for possible LPR and vocal cord polyps who advised BID PPI.  Colon cancer screening: No prior colonoscopy.  Desires screening colonoscopy.   PLAN   Continue omeprazole  40 mg twice daily.  Higher dose needed due to LPR per ENT. Colonoscopy in near future.  ASA 2.  I have discussed the risks, alternatives, benefits with regards to but not limited to the risk of reaction to medication, bleeding, infection, perforation and the patient is agreeable to proceed. Written consent to be obtained.   Laureen Ochs. Bobby Rumpf, [place], [facility]

## 2022-06-25 NOTE — H&P (View-Only) (Signed)
GI Office Note    Referring Provider: Soyla Dryer, PA-C Primary Care Physician:  Soyla Dryer, PA-C  Primary Gastroenterologist: Garfield Cornea, MD   Chief Complaint   Chief Complaint  Patient presents with   Follow-up    Doing better since procedure.     History of Present Illness   Denise Ford is a 61 y.o. female presenting today for follow-up.  She was seen in the office back in March for esophageal stricture. Followed by ENT for years.  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  Recently seen by Dr. Erik Obey for longstanding globus and hoarseness.  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  Suspected reflux laryngitis/esophagitis.  Barium esophagram with diffuse narrowing at the GE junction, additional narrowing of the cervical esophagus from a prominent cricopharyngeus muscle, 12.5 mm tablet transiently obstructed at this location, then obstructed at GEJ, resolved after 10 to 15 minutes. History of benign thyroid nodule and biopsy in 2021.  Followed by Dr. Dorris Fetch.  Patient has never had a colonoscopy.  Since last ov, she had EGD as outlined below. States her swallowing is much better. No longer having pressure in lower chest/epigastric region. No heartburn, vomiting. ENT recommended increasing her omeprazole '40mg'$  twice daily. BM regular. No melena, brbpr.    EGD April 2023: -Subtle EG junction ring / stricture - tatus post dilation and esophageal biopsy as described above. -Reactive squamous mucosa, negative for eosinophils.  No dysplasia.  Findings most consistent with mild inflammation with acid reflux.   Medications   Current Outpatient Medications  Medication Sig Dispense Refill   albuterol (VENTOLIN HFA) 108 (90 Base) MCG/ACT inhaler Inhale 2 puffs into the lungs every 6 (six) hours as needed for wheezing or shortness of breath. 3 each 0   atorvastatin (LIPITOR) 20 MG tablet TAKE 1 Tablet  BY MOUTH ONCE EVERY DAY 90 tablet 0   citalopram (CELEXA) 20 MG tablet Take 1 tablet (20 mg total) by mouth daily. 90 tablet 0   diphenhydrAMINE-APAP, sleep, (GOODYS PM PO) Take by mouth.     omeprazole (PRILOSEC) 40 MG capsule Take 1 capsule (40 mg total) by mouth daily. 1 cap bid 180 capsule 0   No current facility-administered medications for this visit.    Allergies   Allergies as of 06/25/2022   (No Known Allergies)     Past Medical History   Past Medical History:  Diagnosis Date   Anxiety    Hyperlipidemia    Vocal cord polyps     Past Surgical History   Past Surgical History:  Procedure Laterality Date   CESAREAN SECTION     x2   ESOPHAGOGASTRODUODENOSCOPY (EGD) WITH PROPOFOL N/A 03/24/2022   Procedure: ESOPHAGOGASTRODUODENOSCOPY (EGD) WITH PROPOFOL;  Surgeon: Daneil Dolin, MD;  [facility];  Service: Endoscopy;  Laterality: N/A;  1:30pm   MALONEY DILATION N/A 03/24/2022   Procedure: Venia Minks DILATION;  Surgeon: Daneil Dolin, MD;  [facility];  Service: Endoscopy;  Laterality: N/A;   TUBAL LIGATION      Past Family History   Family History  Problem Relation Age of Onset   Hypertension Mother    COPD Mother    Cancer Mother        thoat cancer   Hypertension Father    Cancer Paternal Aunt        pancreatic cancer   Cancer Paternal Uncle    Colon cancer Neg  Hx     Past Social History   Social History   Socioeconomic History   Marital status: Married    Spouse name: Not on file   Number of children: Not on file   Years of education: Not on file   Highest education level: Not on file  Occupational History   Not on file  Tobacco Use   Smoking status: Every Day    Packs/day: 0.50    Years: 30.00    Total pack years: 15.00    Types: Cigarettes   Smokeless tobacco: Never  Vaping Use   Vaping Use: Never used  Substance and Sexual Activity   Alcohol use: Not Currently    Comment: prior heavy etoh use/ seldom   Drug  use: Yes    Frequency: 2.0 times per week    Types: Marijuana    Comment: occ; last used 03/23/22   Sexual activity: Not Currently  Other Topics Concern   Not on file  Social History Narrative   Not on file   Social Determinants of Health   Financial Resource Strain: Not on file  Food Insecurity: Not on file  Transportation Needs: Not on file  Physical Activity: Not on file  Stress: Not on file  Social Connections: Not on file  Intimate Partner Violence: Not on file    Review of Systems   General: Negative for anorexia, weight loss, fever, chills, fatigue, weakness. ENT: Negative for hoarseness, difficulty swallowing , nasal congestion. CV: Negative for chest pain, angina, palpitations, dyspnea on exertion, peripheral edema.  Respiratory: Negative for dyspnea at rest, dyspnea on exertion, cough, sputum, wheezing.  GI: See history of present illness. GU:  Negative for dysuria, hematuria, urinary incontinence, urinary frequency, nocturnal urination.  Endo: Negative for unusual weight change.     Physical Exam   BP 111/74 (BP Location: Left Arm, Patient Position: Sitting, Cuff Size: Normal)   Pulse 86   Temp 97.8 F (36.6 C) (Temporal)   Ht '5\' 4"'$  (1.626 m)   Wt 159 lb 3.2 oz (72.2 kg)   SpO2 96%   BMI 27.33 kg/m    General: Well-nourished, well-developed in no acute distress.  Eyes: No icterus. Mouth: Oropharyngeal mucosa moist and pink , no lesions erythema or exudate. Lungs: Clear to auscultation bilaterally.  Heart: Regular rate and rhythm, no murmurs rubs or gallops.  Abdomen: Bowel sounds are normal, nontender, nondistended, no hepatosplenomegaly or masses,  no abdominal bruits or hernia , no rebound or guarding.  Rectal: not performed  Extremities: No lower extremity edema. No clubbing or deformities. Neuro: Alert and oriented x 4   Skin: Warm and dry, no jaundice.   Psych: Alert and cooperative, normal mood and affect.  Labs   Lab Results  Component Value  Date   CREATININE 0.74 01/23/2022   BUN 15 01/23/2022   NA 138 01/23/2022   K 3.7 01/23/2022   CL 102 01/23/2022   CO2 24 01/23/2022   Lab Results  Component Value Date   ALT 15 04/25/2022   AST 16 04/25/2022   ALKPHOS 64 04/25/2022   BILITOT 0.8 04/25/2022      Imaging Studies   No results found.  Assessment   GERD/esophageal dysphagia/stricture: Doing very well status post EGD with dilation.  Typical reflux well controlled.  Continues to follow with ENT for possible LPR and vocal cord polyps who advised BID PPI.  Colon cancer screening: No prior colonoscopy.  Desires screening colonoscopy.   PLAN   Continue omeprazole  40 mg twice daily.  Higher dose needed due to LPR per ENT. Colonoscopy in near future.  ASA 2.  I have discussed the risks, alternatives, benefits with regards to but not limited to the risk of reaction to medication, bleeding, infection, perforation and the patient is agreeable to proceed. Written consent to be obtained.   Laureen Ochs. Bobby Rumpf, [place], [facility]

## 2022-06-25 NOTE — Patient Instructions (Signed)
Continue omeprazole '40mg'$  twice daily.  Colonoscopy to be scheduled. See separate instructions.

## 2022-07-10 ENCOUNTER — Other Ambulatory Visit: Payer: Self-pay | Admitting: Physician Assistant

## 2022-07-10 DIAGNOSIS — Z131 Encounter for screening for diabetes mellitus: Secondary | ICD-10-CM

## 2022-07-10 DIAGNOSIS — R7309 Other abnormal glucose: Secondary | ICD-10-CM

## 2022-07-10 DIAGNOSIS — E785 Hyperlipidemia, unspecified: Secondary | ICD-10-CM

## 2022-07-15 ENCOUNTER — Other Ambulatory Visit: Payer: Self-pay | Admitting: *Deleted

## 2022-07-15 MED ORDER — PEG 3350-KCL-NA BICARB-NACL 420 G PO SOLR
ORAL | 0 refills | Status: DC
  Filled 2022-07-16: qty 4000, 30d supply, fill #0

## 2022-07-15 NOTE — Progress Notes (Signed)
Patient called in stating [facility] never sent her prep. She called them and was told they do not have it in stock. Patient was never contacted and neither did this office. She advised me to send rx to [facility]. Rx sent

## 2022-07-16 ENCOUNTER — Telehealth: Payer: Self-pay

## 2022-07-16 ENCOUNTER — Other Ambulatory Visit: Payer: Self-pay

## 2022-07-16 NOTE — Telephone Encounter (Signed)
Client called stating she has a colonscopy on Friday and the prep to pick up from [facility] is not affordable as it is over 40$. Will ask [facility] to transfer the prescription and one of [facility] staff will pick up and get it to the patient. She is to start her prep Thursday evening.  Client is agreeable and very appreciative.   Will continue to follow.  [place] [employer]

## 2022-07-17 ENCOUNTER — Other Ambulatory Visit: Payer: Self-pay

## 2022-07-18 ENCOUNTER — Ambulatory Visit (HOSPITAL_COMMUNITY): Payer: Self-pay | Admitting: Anesthesiology

## 2022-07-18 ENCOUNTER — Encounter (HOSPITAL_COMMUNITY)
Admission: RE | Disposition: A | Discharge status: Home / Self Care | Payer: Self-pay | Source: Home / Self Care | Attending: Internal Medicine

## 2022-07-18 ENCOUNTER — Other Ambulatory Visit: Payer: Self-pay

## 2022-07-18 ENCOUNTER — Ambulatory Visit (HOSPITAL_COMMUNITY)
Admission: RE | Admit: 2022-07-18 | Discharge: 2022-07-18 | Disposition: A | Discharge status: Home / Self Care | Payer: Self-pay | Attending: Internal Medicine | Admitting: Internal Medicine

## 2022-07-18 ENCOUNTER — Ambulatory Visit (HOSPITAL_BASED_OUTPATIENT_CLINIC_OR_DEPARTMENT_OTHER): Payer: Self-pay | Admitting: Anesthesiology

## 2022-07-18 ENCOUNTER — Encounter (HOSPITAL_COMMUNITY): Payer: Self-pay | Admitting: Internal Medicine

## 2022-07-18 DIAGNOSIS — J381 Polyp of vocal cord and larynx: Secondary | ICD-10-CM | POA: Insufficient documentation

## 2022-07-18 DIAGNOSIS — Z1212 Encounter for screening for malignant neoplasm of rectum: Secondary | ICD-10-CM

## 2022-07-18 DIAGNOSIS — Z1211 Encounter for screening for malignant neoplasm of colon: Secondary | ICD-10-CM

## 2022-07-18 DIAGNOSIS — K5730 Diverticulosis of large intestine without perforation or abscess without bleeding: Secondary | ICD-10-CM | POA: Insufficient documentation

## 2022-07-18 DIAGNOSIS — K219 Gastro-esophageal reflux disease without esophagitis: Secondary | ICD-10-CM | POA: Insufficient documentation

## 2022-07-18 DIAGNOSIS — D125 Benign neoplasm of sigmoid colon: Secondary | ICD-10-CM | POA: Insufficient documentation

## 2022-07-18 DIAGNOSIS — F17210 Nicotine dependence, cigarettes, uncomplicated: Secondary | ICD-10-CM | POA: Insufficient documentation

## 2022-07-18 DIAGNOSIS — R0602 Shortness of breath: Secondary | ICD-10-CM | POA: Insufficient documentation

## 2022-07-18 DIAGNOSIS — F419 Anxiety disorder, unspecified: Secondary | ICD-10-CM | POA: Insufficient documentation

## 2022-07-18 DIAGNOSIS — K635 Polyp of colon: Secondary | ICD-10-CM

## 2022-07-18 HISTORY — PX: POLYPECTOMY: SHX5525

## 2022-07-18 HISTORY — PX: COLONOSCOPY WITH PROPOFOL: SHX5780

## 2022-07-18 SURGERY — COLONOSCOPY WITH PROPOFOL
Anesthesia: General

## 2022-07-18 MED ORDER — PHENYLEPHRINE HCL (PRESSORS) 10 MG/ML IV SOLN
INTRAVENOUS | Status: DC | PRN
  Administered 2022-07-18 (×3): 80 ug via INTRAVENOUS

## 2022-07-18 MED ORDER — PROPOFOL 10 MG/ML IV BOLUS
INTRAVENOUS | Status: DC | PRN
  Administered 2022-07-18: 10 mg via INTRAVENOUS
  Administered 2022-07-18: 150 mg via INTRAVENOUS
  Administered 2022-07-18: 10 mg via INTRAVENOUS
  Administered 2022-07-18 (×2): 30 mg via INTRAVENOUS

## 2022-07-18 MED ORDER — LACTATED RINGERS IV SOLN
INTRAVENOUS | Status: DC
  Administered 2022-07-18: 1000 mL via INTRAVENOUS

## 2022-07-18 NOTE — Anesthesia Postprocedure Evaluation (Signed)
Anesthesia Post Note  Patient: Denise Ford  Procedure(s) Performed: COLONOSCOPY WITH PROPOFOL POLYPECTOMY  Patient location during evaluation: Phase II Anesthesia Type: General Level of consciousness: awake and alert and oriented Pain management: pain level controlled Vital Signs Assessment: post-procedure vital signs reviewed and stable Respiratory status: spontaneous breathing, nonlabored ventilation and respiratory function stable Cardiovascular status: blood pressure returned to baseline and stable Postop Assessment: no apparent nausea or vomiting Anesthetic complications: no   No notable events documented.   Last Vitals:  Vitals:   07/18/22 1203 07/18/22 1247  BP: 103/61 95/72  Pulse: 82 75  Resp: 19 14  Temp: 36.7 C 36.6 C  SpO2: 98% 99%    Last Pain:  Vitals:   07/18/22 1247  TempSrc: Oral  PainSc: 0-No pain                 Julietta Batterman C [place] Mcnally

## 2022-07-18 NOTE — Interval H&P Note (Signed)
History and Physical Interval Note:  07/18/2022 11:58 AM  Denise Ford  has presented today for surgery, with the diagnosis of screening.  The various methods of treatment have been discussed with the patient and family. After consideration of risks, benefits and other options for treatment, the patient has consented to  Procedure(s) with comments: COLONOSCOPY WITH PROPOFOL (N/A) - 12:45pm as a surgical intervention.  The patient's history has been reviewed, patient examined, no change in status, stable for surgery.  I have reviewed the patient's chart and labs.  Questions were answered to the patient's satisfaction.     Denise Ford  No change.  First-ever average rescreening colonoscopy today per plan. The risks, benefits, limitations, alternatives and imponderables have been reviewed with the patient. Questions have been answered. All parties are agreeable.

## 2022-07-18 NOTE — Anesthesia Procedure Notes (Signed)
Date/Time: 07/18/2022 12:20 PM  Performed by: Vista Deck, CRNAPre-anesthesia Checklist: Patient identified, Emergency Drugs available, Suction available, Timeout performed and Patient being monitored Patient Re-evaluated:Patient Re-evaluated prior to induction Oxygen Delivery Method: Nasal Cannula

## 2022-07-18 NOTE — Transfer of Care (Signed)
Immediate Anesthesia Transfer of Care Note  Patient: Denise Ford  Procedure(s) Performed: COLONOSCOPY WITH PROPOFOL POLYPECTOMY  Patient Location: Endoscopy Unit  Anesthesia Type:General  Level of Consciousness: awake and patient cooperative  Airway & Oxygen Therapy: Patient Spontanous Breathing  Post-op Assessment: Report given to RN and Post -op Vital signs reviewed and stable  Post vital signs: Reviewed and stable  Last Vitals:  Vitals Value Taken Time  BP 95/72 07/18/22 1247  Temp 36.6 C 07/18/22 1247  Pulse 75 07/18/22 1247  Resp 14 07/18/22 1247  SpO2 99 % 07/18/22 1247    Last Pain:  Vitals:   07/18/22 1247  TempSrc: Oral  PainSc: 0-No pain      Patients Stated Pain Goal: 6 (20/03/79 4446)  Complications: No notable events documented.

## 2022-07-18 NOTE — Op Note (Signed)
[facility] Patient Name: Denise Ford Procedure Date: 07/18/2022 12:19 PM MRN: [id number] Date of Birth: 1961/03/20 Attending MD: Norvel Richards , MD CSN: [id number] Age: 61 Admit Type: Outpatient Procedure:                Colonoscopy Indications:              Screening for colorectal malignant neoplasm Providers:                Norvel Richards, MD, Rosina Lowenstein, RN, Raphael Gibney, Technician Referring MD:              Medicines:                Propofol per Anesthesia Complications:            No immediate complications. Estimated Blood Loss:     Estimated blood loss was minimal. Procedure:                Pre-Anesthesia Assessment:                           - Prior to the procedure, a History and Physical                            was performed, and patient medications and                            allergies were reviewed. The patient's tolerance of                            previous anesthesia was also reviewed. The risks                            and benefits of the procedure and the sedation                            options and risks were discussed with the patient.                            All questions were answered, and informed consent                            was obtained. Prior Anticoagulants: The patient has                            taken no previous anticoagulant or antiplatelet                            agents. ASA Grade Assessment: II - A patient with                            mild systemic disease. After reviewing the risks  and benefits, the patient was deemed in                            satisfactory condition to undergo the procedure.                           After obtaining informed consent, the colonoscope                            was passed under direct vision. Throughout the                            procedure, the patient's blood pressure, pulse, and                             oxygen saturations were monitored continuously. The                            [id number]) scope was introduced through the                            anus and advanced to the the cecum, identified by                            appendiceal orifice and ileocecal valve. The                            colonoscopy was performed without difficulty. The                            patient tolerated the procedure well. The quality                            of the bowel preparation was adequate. Scope In: 12:25:31 PM Scope Out: 12:42:44 PM Scope Withdrawal Time: 0 hours 12 minutes 34 seconds  Total Procedure Duration: 0 hours 17 minutes 13 seconds  Findings:      The perianal and digital rectal examinations were normal.      Scattered small-mouthed diverticula were found in the sigmoid colon and       descending colon.      Two sessile polyps were found in the sigmoid colon. The polyps were 4 to       6 mm in size. These polyps were removed with a cold snare. Resection and       retrieval were complete. Estimated blood loss was minimal.      A 12 mm polyp was found in the sigmoid colon. The polyp was sessile. The       polyp was removed with a hot snare. Resection and retrieval were       complete. Estimated blood loss: none.      The exam was otherwise without abnormality on direct and retroflexion       views. Impression:               - Diverticulosis in the sigmoid colon and in the  descending colon.                           - Two 4 to 6 mm polyps in the sigmoid colon,                            removed with a cold snare. Resected and retrieved.                           - One 12 mm polyp in the sigmoid colon, removed                            with a hot snare. Resected and retrieved.                           - The examination was otherwise normal on direct                            and retroflexion views. Moderate Sedation:      Moderate  (conscious) sedation was personally administered by an       anesthesia professional. The following parameters were monitored: oxygen       saturation, heart rate, blood pressure, respiratory rate, EKG, adequacy       of pulmonary ventilation, and response to care. Recommendation:           - Patient has a contact number available for                            emergencies. The signs and symptoms of potential                            delayed complications were discussed with the                            patient. Return to normal activities tomorrow.                            Written discharge instructions were provided to the                            patient.                           - Advance diet as tolerated.                           - Continue present medications.                           - Repeat colonoscopy date to be determined after                            pending pathology results are reviewed for  surveillance.                           - Return to GI office after studies are complete. Procedure Code(s):        --- Professional ---                           [phone], Colonoscopy, flexible; with removal of                            tumor(s), polyp(s), or other lesion(s) by snare                            technique Diagnosis Code(s):        --- Professional ---                           K63.5, Polyp of colon                           Z12.11, Encounter for screening for malignant                            neoplasm of colon                           K57.30, Diverticulosis of large intestine without                            perforation or abscess without bleeding CPT copyright 2019 [employer] Association. All rights reserved. The codes documented in this report are preliminary and upon coder review may  be revised to meet current compliance requirements. [name]. [name], MD Norvel Richards, MD 07/18/2022 12:48:54 PM This report  has been signed electronically. Number of Addenda: 0

## 2022-07-18 NOTE — Anesthesia Preprocedure Evaluation (Addendum)
Anesthesia Evaluation  Patient identified by MRN, date of birth, ID band Patient awake    Reviewed: Allergy & Precautions, NPO status , Patient's Chart, lab work & pertinent test results  Airway Mallampati: II  TM Distance: >3 FB Neck ROM: Full   Comment: Vocal cord polyps  Dental  (+) Dental Advisory Given, Edentulous Upper, Missing, Chipped   Pulmonary shortness of breath and with exertion, Current SmokerPatient did not abstain from smoking.,    Pulmonary exam normal breath sounds clear to auscultation       Cardiovascular negative cardio ROS Normal cardiovascular exam Rhythm:Regular Rate:Normal     Neuro/Psych PSYCHIATRIC DISORDERS Anxiety negative neurological ROS     GI/Hepatic GERD  Medicated,(+)     substance abuse  marijuana use,   Endo/Other  negative endocrine ROS  Renal/GU negative Renal ROS  negative genitourinary   Musculoskeletal negative musculoskeletal ROS (+)   Abdominal   Peds negative pediatric ROS (+)  Hematology negative hematology ROS (+)   Anesthesia Other Findings Vocal cord polyps  Reproductive/Obstetrics negative OB ROS                            Anesthesia Physical Anesthesia Plan  ASA: 2  Anesthesia Plan: General   Post-op Pain Management: Minimal or no pain anticipated   Induction: Intravenous  PONV Risk Score and Plan: TIVA  Airway Management Planned: Nasal Cannula and Natural Airway  Additional Equipment:   Intra-op Plan:   Post-operative Plan:   Informed Consent: I have reviewed the patients History and Physical, chart, labs and discussed the procedure including the risks, benefits and alternatives for the proposed anesthesia with the patient or authorized representative who has indicated his/her understanding and acceptance.     Dental advisory given  Plan Discussed with: CRNA and Surgeon  Anesthesia Plan Comments:         Anesthesia Quick Evaluation

## 2022-07-18 NOTE — Discharge Instructions (Addendum)
  Colonoscopy Discharge Instructions  Read the instructions outlined below and refer to this sheet in the next few weeks. These discharge instructions provide you with general information on caring for yourself after you leave the hospital. Your doctor may also give you specific instructions. While your treatment has been planned according to the most current medical practices available, unavoidable complications occasionally occur. If you have any problems or questions after discharge, call Dr. Gala Romney at [phone]. ACTIVITY You may resume your regular activity, but move at a slower pace for the next 24 hours.  Take frequent rest periods for the next 24 hours.  Walking will help get rid of the air and reduce the bloated feeling in your belly (abdomen).  No driving for 24 hours (because of the medicine (anesthesia) used during the test).   Do not sign any important legal documents or operate any machinery for 24 hours (because of the anesthesia used during the test).  NUTRITION Drink plenty of fluids.  You may resume your normal diet as instructed by your doctor.  Begin with a light meal and progress to your normal diet. Heavy or fried foods are harder to digest and may make you feel sick to your stomach (nauseated).  Avoid alcoholic beverages for 24 hours or as instructed.  MEDICATIONS You may resume your normal medications unless your doctor tells you otherwise.  WHAT YOU CAN EXPECT TODAY Some feelings of bloating in the abdomen.  Passage of more gas than usual.  Spotting of blood in your stool or on the toilet paper.  IF YOU HAD POLYPS REMOVED DURING THE COLONOSCOPY: No aspirin products for 7 days or as instructed.  No alcohol for 7 days or as instructed.  Eat a soft diet for the next 24 hours.  FINDING OUT THE RESULTS OF YOUR TEST Not all test results are available during your visit. If your test results are not back during the visit, make an appointment with your caregiver to find out the  results. Do not assume everything is normal if you have not heard from your caregiver or the medical facility. It is important for you to follow up on all of your test results.  SEEK IMMEDIATE MEDICAL ATTENTION IF: You have more than a spotting of blood in your stool.  Your belly is swollen (abdominal distention).  You are nauseated or vomiting.  You have a temperature over 101.  You have abdominal pain or discomfort that is severe or gets worse throughout the day.    3 polyps removed from your colon  Diverticulosis and colon polyp information provided  Further recommendations to follow pending review of pathology report  At patient request, I called Johnny [phone]-rolled to voicemail "not set up yet"

## 2022-07-21 LAB — SURGICAL PATHOLOGY

## 2022-07-22 ENCOUNTER — Encounter: Payer: Self-pay | Admitting: Internal Medicine

## 2022-07-24 ENCOUNTER — Encounter (HOSPITAL_COMMUNITY): Payer: Self-pay | Admitting: Internal Medicine

## 2022-07-29 ENCOUNTER — Ambulatory Visit: Payer: Self-pay | Admitting: Physician Assistant

## 2022-08-05 ENCOUNTER — Other Ambulatory Visit (HOSPITAL_COMMUNITY)
Admission: RE | Admit: 2022-08-05 | Discharge: 2022-08-05 | Disposition: A | Discharge status: Home / Self Care | Payer: Self-pay | Source: Ambulatory Visit | Attending: Physician Assistant | Admitting: Physician Assistant

## 2022-08-05 DIAGNOSIS — E785 Hyperlipidemia, unspecified: Secondary | ICD-10-CM | POA: Insufficient documentation

## 2022-08-05 DIAGNOSIS — Z131 Encounter for screening for diabetes mellitus: Secondary | ICD-10-CM | POA: Insufficient documentation

## 2022-08-05 DIAGNOSIS — R7309 Other abnormal glucose: Secondary | ICD-10-CM | POA: Insufficient documentation

## 2022-08-05 LAB — COMPREHENSIVE METABOLIC PANEL
ALT: 11 U/L (ref 0–44)
AST: 13 U/L — ABNORMAL LOW (ref 15–41)
Albumin: 3.9 g/dL (ref 3.5–5.0)
Alkaline Phosphatase: 67 U/L (ref 38–126)
Anion gap: 5 (ref 5–15)
BUN: 14 mg/dL (ref 6–20)
CO2: 31 mmol/L (ref 22–32)
Calcium: 9.7 mg/dL (ref 8.9–10.3)
Chloride: 102 mmol/L (ref 98–111)
Creatinine, Ser: 0.7 mg/dL (ref 0.44–1.00)
GFR, Estimated: >60 mL/min (ref >60)
Glucose, Bld: 92 mg/dL (ref 70–99)
Potassium: 4.9 mmol/L (ref 3.5–5.1)
Sodium: 138 mmol/L (ref 135–145)
Total Bilirubin: 0.3 mg/dL (ref 0.3–1.2)
Total Protein: 6.9 g/dL (ref 6.5–8.1)

## 2022-08-05 LAB — HEMOGLOBIN A1C
Hgb A1c MFr Bld: 5.3 % (ref 4.8–5.6)
Mean Plasma Glucose: 105.41 mg/dL

## 2022-08-05 LAB — LIPID PANEL
Cholesterol: 183 mg/dL (ref 0–200)
HDL: 58 mg/dL (ref >40)
LDL Cholesterol: 89 mg/dL (ref 0–99)
Total CHOL/HDL Ratio: 3.2 RATIO
Triglycerides: 181 mg/dL — ABNORMAL HIGH (ref <150)
VLDL: 36 mg/dL (ref 0–40)

## 2022-08-12 ENCOUNTER — Ambulatory Visit: Payer: Self-pay | Admitting: Physician Assistant

## 2022-08-12 ENCOUNTER — Encounter: Payer: Self-pay | Admitting: Physician Assistant

## 2022-08-12 DIAGNOSIS — R490 Dysphonia: Secondary | ICD-10-CM

## 2022-08-12 DIAGNOSIS — J449 Chronic obstructive pulmonary disease, unspecified: Secondary | ICD-10-CM

## 2022-08-12 DIAGNOSIS — F17200 Nicotine dependence, unspecified, uncomplicated: Secondary | ICD-10-CM

## 2022-08-12 DIAGNOSIS — E785 Hyperlipidemia, unspecified: Secondary | ICD-10-CM

## 2022-08-12 DIAGNOSIS — J381 Polyp of vocal cord and larynx: Secondary | ICD-10-CM

## 2022-08-12 NOTE — Progress Notes (Signed)
There were no vitals taken for this visit.   Subjective:    Patient ID: Denise Ford, female    DOB: 16-Sep-1961, 61 y.o.   MRN: [id number]  HPI: Denise Ford is a 61 y.o. female presenting on 08/12/2022 for No chief complaint on file.   HPI   This is a telemedicine appointment via telephone.  Pt was scheduled for in-person appointment but called the office earlier this morning reporting diarrhea so she didn't want to come into the office.  Her appointment was changed to virtual; she does not have ability to accommodate video so her appointment was done via telephone.   I connected with  Denise Ford on 08/12/22 by a video enabled telemedicine application and verified that I am speaking with the correct person using two identifiers.   I discussed the limitations of evaluation and management by telemedicine. The patient expressed understanding and agreed to proceed.      Pt tells me that she does not have diarrhea.   She says she didn't want to come in because she has sore throat that started Saturday.  She is taking hall's.    She has No fevers.  She has some coughing but her inhaler helped.     She has not taken a covid test but she says she hasn't been going anywhere.  She says her sore throat is improving.   She says her hoarse has been worse.     Her grandson that is 79yo is with her visiting this week;  possibly this is reason pt did not want to come into the office today.  She is not using the fluticasone.   It was discussed at her last appointment.  She says she has polyps that cause her hoarseness.  Says this is what ENT told her.    She had colonoscopy.   She is going back to GI in 3 years to see GI for follow up      Relevant past medical, surgical, family and social history reviewed and updated as indicated. Interim medical history since our last visit reviewed. Allergies and medications reviewed and updated.    Current Outpatient Medications:     albuterol (VENTOLIN HFA) 108 (90 Base) MCG/ACT inhaler, Inhale 2 puffs into the lungs every 6 (six) hours as needed for wheezing or shortness of breath., Disp: 3 each, Rfl: 0   atorvastatin (LIPITOR) 20 MG tablet, TAKE 1 Tablet BY MOUTH ONCE EVERY DAY, Disp: 90 tablet, Rfl: 0   citalopram (CELEXA) 20 MG tablet, Take 1 tablet (20 mg total) by mouth daily., Disp: 90 tablet, Rfl: 0   omeprazole (PRILOSEC) 40 MG capsule, Take 1 capsule (40 mg total) by mouth daily. 1 cap bid (Patient taking differently: Take 40 mg by mouth in the morning and at bedtime.), Disp: 180 capsule, Rfl: 0   Review of Systems  Per HPI unless specifically indicated above     Objective:    There were no vitals taken for this visit.  Wt Readings from Last 3 Encounters:  07/18/22 157 lb (71.2 kg)  06/25/22 159 lb 3.2 oz (72.2 kg)  05/28/22 159 lb (72.1 kg)    Physical Exam HENT:     Mouth/Throat:     Comments: Voice is hoarse (without noticeable change from her last appointment) Pulmonary:     Effort: Pulmonary effort is normal. No respiratory distress.     Comments: Pt is speaking in complete sentences without having to stop due to sob.  Neurological:     Mental Status: She is alert and oriented to person, place, and time.  Psychiatric:        Behavior: Behavior normal.     Results for orders placed or performed during the hospital encounter of 08/05/22  Lipid panel  Result Value Ref Range   Cholesterol 183 0 - 200 mg/dL   Triglycerides 181 (H) <150 mg/dL   HDL 58 >40 mg/dL   Total CHOL/HDL Ratio 3.2 RATIO   VLDL 36 0 - 40 mg/dL   LDL Cholesterol 89 0 - 99 mg/dL  Comprehensive metabolic panel  Result Value Ref Range   Sodium 138 135 - 145 mmol/L   Potassium 4.9 3.5 - 5.1 mmol/L   Chloride 102 98 - 111 mmol/L   CO2 31 22 - 32 mmol/L   Glucose, Bld 92 70 - 99 mg/dL   BUN 14 6 - 20 mg/dL   Creatinine, Ser 0.70 0.44 - 1.00 mg/dL   Calcium 9.7 8.9 - 10.3 mg/dL   Total Protein 6.9 6.5 - 8.1 g/dL    Albumin 3.9 3.5 - 5.0 g/dL   AST 13 (L) 15 - 41 U/L   ALT 11 0 - 44 U/L   Alkaline Phosphatase 67 38 - 126 U/L   Total Bilirubin 0.3 0.3 - 1.2 mg/dL   GFR, Estimated >60 >60 mL/min   Anion gap 5 5 - 15  Hemoglobin A1c  Result Value Ref Range   Hgb A1c MFr Bld 5.3 4.8 - 5.6 %   Mean Plasma Glucose 105.41 mg/dL      Assessment & Plan:   Encounter Diagnoses  Name Primary?   Hyperlipidemia, unspecified hyperlipidemia type Yes   Chronic obstructive pulmonary disease, unspecified COPD type ([place])    Tobacco use disorder    Hoarseness, chronic    Vocal cord polyps        -Reviewed labs with pt -no changes to medications today -pt is not interested in smoking cessation or maintenance inhaler -Pt to follow up 3 months.  She is to contact office sooner prn

## 2022-09-15 ENCOUNTER — Other Ambulatory Visit: Payer: Self-pay | Admitting: Physician Assistant

## 2022-09-17 ENCOUNTER — Ambulatory Visit (HOSPITAL_COMMUNITY)
Admission: RE | Admit: 2022-09-17 | Discharge: 2022-09-17 | Disposition: A | Discharge status: Home / Self Care | Payer: Self-pay | Source: Ambulatory Visit | Attending: "Endocrinology | Admitting: "Endocrinology

## 2022-09-17 DIAGNOSIS — E049 Nontoxic goiter, unspecified: Secondary | ICD-10-CM | POA: Insufficient documentation

## 2022-10-07 ENCOUNTER — Telehealth: Payer: Self-pay | Admitting: "Endocrinology

## 2022-10-07 DIAGNOSIS — E049 Nontoxic goiter, unspecified: Secondary | ICD-10-CM

## 2022-10-07 NOTE — Telephone Encounter (Signed)
Pt's needs her labs updated. Thank you

## 2022-10-08 NOTE — Telephone Encounter (Signed)
Labs updated and sent to [facility]. ?

## 2022-10-13 ENCOUNTER — Telehealth: Payer: Self-pay | Admitting: "Endocrinology

## 2022-10-13 NOTE — Telephone Encounter (Signed)
CAN YOU UPDATE PATIENT'S LABS FOR ME? SHE HAS AN APPT THIS THURSDAY

## 2022-10-13 NOTE — Telephone Encounter (Signed)
NEVERMIND. ALREADY DONE

## 2022-10-14 ENCOUNTER — Other Ambulatory Visit (HOSPITAL_COMMUNITY)
Admission: RE | Admit: 2022-10-14 | Discharge: 2022-10-14 | Disposition: A | Discharge status: Home / Self Care | Payer: Self-pay | Source: Ambulatory Visit | Attending: "Endocrinology | Admitting: "Endocrinology

## 2022-10-14 DIAGNOSIS — E049 Nontoxic goiter, unspecified: Secondary | ICD-10-CM | POA: Insufficient documentation

## 2022-10-14 LAB — T4, FREE: Free T4: 0.91 ng/dL (ref 0.61–1.12)

## 2022-10-14 LAB — TSH: TSH: 1.908 u[IU]/mL (ref 0.350–4.500)

## 2022-10-15 LAB — T3, FREE: T3, Free: 3.1 pg/mL (ref 2.0–4.4)

## 2022-10-16 ENCOUNTER — Ambulatory Visit (INDEPENDENT_AMBULATORY_CARE_PROVIDER_SITE_OTHER): Payer: Self-pay | Admitting: "Endocrinology

## 2022-10-16 ENCOUNTER — Encounter: Payer: Self-pay | Admitting: "Endocrinology

## 2022-10-16 VITALS — BP 124/76 | HR 64 | Ht 64.0 in | Wt 155.2 lb

## 2022-10-16 DIAGNOSIS — E049 Nontoxic goiter, unspecified: Secondary | ICD-10-CM

## 2022-10-16 NOTE — Progress Notes (Signed)
10/16/2022, 1:53 PM  Endocrinology follow-up note   Subjective:    Patient ID: Denise Ford, female    DOB: 1961-11-20, PCP Soyla Dryer, PA-C   Past Medical History:  Diagnosis Date   Anxiety    Hyperlipidemia    Vocal cord polyps    Past Surgical History:  Procedure Laterality Date   CESAREAN SECTION     x2   COLONOSCOPY WITH PROPOFOL N/A 07/18/2022   Procedure: COLONOSCOPY WITH PROPOFOL;  Surgeon: Daneil Dolin, MD;  [facility]: [facility];  Service: Endoscopy;  Laterality: N/A;  12:45pm   ESOPHAGOGASTRODUODENOSCOPY (EGD) WITH PROPOFOL N/A 03/24/2022   Procedure: ESOPHAGOGASTRODUODENOSCOPY (EGD) WITH PROPOFOL;  Surgeon: Daneil Dolin, MD;  Location: [facility];  Service: Endoscopy;  Laterality: N/A;  1:30pm   MALONEY DILATION N/A 03/24/2022   Procedure: Venia Minks DILATION;  Surgeon: Daneil Dolin, MD;  Location: [facility];  Service: Endoscopy;  Laterality: N/A;   POLYPECTOMY  07/18/2022   Procedure: POLYPECTOMY;  Surgeon: Daneil Dolin, MD;  Location: [facility];  Service: Endoscopy;;   TUBAL LIGATION     Social History   Socioeconomic History   Marital status: Married    Spouse name: Not on file   Number of children: Not on file   Years of education: Not on file   Highest education level: Not on file  Occupational History   Not on file  Tobacco Use   Smoking status: Every Day    Packs/day: 0.50    Years: 30.00    Total pack years: 15.00    Types: Cigarettes   Smokeless tobacco: Never  Vaping Use   Vaping Use: Never used  Substance and Sexual Activity   Alcohol use: Not Currently    Comment: prior heavy etoh use/ seldom   Drug use: Yes    Frequency: 2.0 times per week    Types: Marijuana    Comment: occ; last used 03/23/22   Sexual activity: Not Currently  Other Topics Concern   Not on file  Social History Narrative   Not on file   Social Determinants of Health   Financial  Resource Strain: Not on file  Food Insecurity: Not on file  Transportation Needs: Not on file  Physical Activity: Not on file  Stress: Not on file  Social Connections: Not on file   Family History  Problem Relation Age of Onset   Hypertension Mother    COPD Mother    Cancer Mother        thoat cancer   Hypertension Father    Cancer Paternal Aunt        pancreatic cancer   Cancer Paternal Uncle    Colon cancer Neg Hx    Outpatient Encounter Medications as of 10/16/2022  Medication Sig   albuterol (VENTOLIN HFA) 108 (90 Base) MCG/ACT inhaler INHALE 2 PUFFS BY MOUTH EVERY 6 HOURS AS NEEDED FOR COUGHING, WHEEZING, OR SHORTNESS OF BREATH   atorvastatin (LIPITOR) 20 MG tablet TAKE 1 Tablet BY MOUTH ONCE EVERY DAY   citalopram (CELEXA) 20 MG tablet TAKE 1 Tablet BY MOUTH ONCE EVERY DAY   omeprazole (PRILOSEC) 40 MG capsule Take 1 capsule (40 mg total) by mouth in  the morning and at bedtime.   No facility-administered encounter medications on file as of 10/16/2022.   ALLERGIES: No Known Allergies  VACCINATION STATUS: Immunization History  Administered Date(s) Administered   Moderna Sars-Covid-2 Vaccination 05/04/2020, 06/01/2020, 11/11/2021    HPI Denise Ford is 61 y.o. female who presents today to review her biopsy results after she was seen in consultation for nodular goiter last visit.   PCP:  Soyla Dryer, PA-C.   She is known to have nodular goiter since 2016.  She is not on any thyroid hormone supplements or antithyroid medications.  She underwent previsit thyroid ultrasound which showed similar findings.  She underwent fine-needle aspiration biopsy of this right lobe thyroid nodule with benign findings.  Her previsit thyroid function tests are also consistent with euthyroid presentation.  She has no new complaints today.  She continues to smoke.  She is a chronic heavy smoker, reports some voice change recently, continues to be horsy. She denies dysphagia, shortness  of breath.   -She denies weight loss, palpitations, heat/cold intolerance.  She has some unidentified thyroid dysfunction in one of her grandparents.     Review of Systems  Limited as above.  Objective:       10/16/2022    1:19 PM 07/18/2022   12:47 PM 07/18/2022   12:03 PM  Vitals with BMI  Height '5\' 4"'$   '5\' 4"'$   Weight 155 lbs 3 oz  157 lbs  BMI 15.94  58.59  Systolic 292 95 446  Diastolic 76 72 61  Pulse 64 75 82    BP 124/76   Pulse 64   Ht '5\' 4"'$  (1.626 m)   Wt 155 lb 3.2 oz (70.4 kg)   BMI 26.64 kg/m   Wt Readings from Last 3 Encounters:  10/16/22 155 lb 3.2 oz (70.4 kg)  07/18/22 157 lb (71.2 kg)  06/25/22 159 lb 3.2 oz (72.2 kg)    Physical Exam  Constitutional:  Body mass index is 26.64 kg/m.,  not in acute distress, normal state of mind Eyes: PERRLA, EOMI, no exophthalmos ENT: moist mucous membranes, + gross thyromegaly, no gross cervical lymphadenopathy   CMP ( most recent) CMP     Component Value Date/Time   NA 138 08/05/2022 1118   K 4.9 08/05/2022 1118   CL 102 08/05/2022 1118   CO2 31 08/05/2022 1118   GLUCOSE 92 08/05/2022 1118   BUN 14 08/05/2022 1118   CREATININE 0.70 08/05/2022 1118   CALCIUM 9.7 08/05/2022 1118   PROT 6.9 08/05/2022 1118   ALBUMIN 3.9 08/05/2022 1118   AST 13 (L) 08/05/2022 1118   ALT 11 08/05/2022 1118   ALKPHOS 67 08/05/2022 1118   BILITOT 0.3 08/05/2022 1118   GFRNONAA >60 08/05/2022 1118   GFRAA >60 08/22/2020 0949     Diabetic Labs (most recent): Lab Results  Component Value Date   HGBA1C 5.3 08/05/2022   HGBA1C 5.4 08/22/2020     Lipid Panel ( most recent) Lipid Panel     Component Value Date/Time   CHOL 183 08/05/2022 1118   TRIG 181 (H) 08/05/2022 1118   HDL 58 08/05/2022 1118   CHOLHDL 3.2 08/05/2022 1118   VLDL 36 08/05/2022 1118   LDLCALC 89 08/05/2022 1118      Lab Results  Component Value Date   TSH 1.908 10/14/2022   TSH 2.239 10/22/2021   TSH 1.836 08/22/2020   FREET4 0.91  10/14/2022   FREET4 0.69 10/22/2021   FREET4 0.77 08/22/2020  Thyroid ultrasound on August 14, 2020: Right lobe measured 5.4 cm with 2.6 cm nodule which increased in size from 2.2 cm in 2016 Left lobe measured 4.3 cm with no discrete nodules.   Fine-needle aspiration of right lobe nodule on October 10, 2020 FINAL MICROSCOPIC DIAGNOSIS:  - Consistent with benign follicular nodule ([place] category II)  SPECIMEN ADEQUACY:  Satisfactory for evaluation    Assessment & Plan:   1. Nodular goiter-benign FNA 2. Current smoker -Her fine-needle aspiration biopsy has been negative for malignancy.  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  -She will not need surgery or antithyroid intervention at this time.  She will return in 1 year with repeat thyroid function test.    The patient was counseled on the dangers of tobacco use, and was advised to quit.  Reviewed strategies to maximize success, including removing cigarettes and smoking materials from environment.   If she continues to have voice hoarseness, she will benefit from evaluation by ENT given her history of heavy smoking.   - she is advised to maintain close follow up with Soyla Dryer, PA-C for primary care needs.   I spent 21 minutes in the care of the patient today including review of labs from Thyroid Function, CMP, and other relevant labs ; imaging/biopsy records (current and previous including abstractions from other facilities); face-to-face time discussing  her lab results and symptoms, medications doses, her options of short and long term treatment based on the latest standards of care / guidelines;   and documenting the encounter.  Denise Ford  participated in the discussions, expressed understanding, and voiced agreement with the above plans.  All questions were answered to her satisfaction. she is encouraged to contact clinic should she have any questions or concerns prior to her return  visit.    Follow up plan: Return in about 1 year (around 10/17/2023) for F/U with Pre-visit Labs.   Glade Lloyd, MD [facility] [address] [place], [place] [address] Phone: [phone]  Fax: [phone]     10/16/2022, 1:53 PM  This note was partially dictated with voice recognition software. Similar sounding words can be transcribed inadequately or may not  be corrected upon review.

## 2022-10-27 ENCOUNTER — Ambulatory Visit: Payer: Self-pay | Admitting: "Endocrinology

## 2022-11-03 ENCOUNTER — Other Ambulatory Visit: Payer: Self-pay | Admitting: Physician Assistant

## 2022-11-03 DIAGNOSIS — E785 Hyperlipidemia, unspecified: Secondary | ICD-10-CM

## 2022-11-24 ENCOUNTER — Ambulatory Visit: Payer: Self-pay | Admitting: Physician Assistant

## 2022-11-28 ENCOUNTER — Other Ambulatory Visit (HOSPITAL_COMMUNITY)
Admission: RE | Admit: 2022-11-28 | Discharge: 2022-11-28 | Disposition: A | Discharge status: Home / Self Care | Payer: Self-pay | Source: Ambulatory Visit | Attending: Physician Assistant | Admitting: Physician Assistant

## 2022-11-28 DIAGNOSIS — E785 Hyperlipidemia, unspecified: Secondary | ICD-10-CM | POA: Insufficient documentation

## 2022-11-28 LAB — LIPID PANEL
Cholesterol: 216 mg/dL — ABNORMAL HIGH (ref 0–200)
HDL: 80 mg/dL (ref >40)
LDL Cholesterol: 105 mg/dL — ABNORMAL HIGH (ref 0–99)
Total CHOL/HDL Ratio: 2.7 RATIO
Triglycerides: 156 mg/dL — ABNORMAL HIGH (ref <150)
VLDL: 31 mg/dL (ref 0–40)

## 2022-11-28 LAB — COMPREHENSIVE METABOLIC PANEL
ALT: 23 U/L (ref 0–44)
AST: 25 U/L (ref 15–41)
Albumin: 3.9 g/dL (ref 3.5–5.0)
Alkaline Phosphatase: 62 U/L (ref 38–126)
Anion gap: 6 (ref 5–15)
BUN: 10 mg/dL (ref 6–20)
CO2: 30 mmol/L (ref 22–32)
Calcium: 9.7 mg/dL (ref 8.9–10.3)
Chloride: 100 mmol/L (ref 98–111)
Creatinine, Ser: 0.68 mg/dL (ref 0.44–1.00)
GFR, Estimated: >60 mL/min (ref >60)
Glucose, Bld: 86 mg/dL (ref 70–99)
Potassium: 4.8 mmol/L (ref 3.5–5.1)
Sodium: 136 mmol/L (ref 135–145)
Total Bilirubin: 0.7 mg/dL (ref 0.3–1.2)
Total Protein: 7.1 g/dL (ref 6.5–8.1)

## 2022-12-02 ENCOUNTER — Encounter: Payer: Self-pay | Admitting: Physician Assistant

## 2022-12-02 ENCOUNTER — Ambulatory Visit: Payer: Self-pay | Admitting: Physician Assistant

## 2022-12-02 VITALS — BP 112/74 | HR 79 | Temp 97.1°F

## 2022-12-02 DIAGNOSIS — F419 Anxiety disorder, unspecified: Secondary | ICD-10-CM

## 2022-12-02 DIAGNOSIS — J449 Chronic obstructive pulmonary disease, unspecified: Secondary | ICD-10-CM

## 2022-12-02 DIAGNOSIS — K219 Gastro-esophageal reflux disease without esophagitis: Secondary | ICD-10-CM

## 2022-12-02 DIAGNOSIS — F17200 Nicotine dependence, unspecified, uncomplicated: Secondary | ICD-10-CM

## 2022-12-02 DIAGNOSIS — E785 Hyperlipidemia, unspecified: Secondary | ICD-10-CM

## 2022-12-02 MED ORDER — SERTRALINE HCL 25 MG PO TABS: 25.0000 mg | ORAL_TABLET | Freq: Every day | ORAL | 0 refills | Status: DC

## 2022-12-02 NOTE — Progress Notes (Signed)
BP 112/74   Pulse 79   Temp (!) 97.1 F (36.2 C)   SpO2 96%    Subjective:    Patient ID: Denise Ford, female    DOB: 08-11-1961, 61 y.o.   MRN: [id number]  HPI: Denise Ford is a 61 y.o. 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  She says he is actually doing better now.  She is Using inhaler maybe 3/week  He Asks about rx for anxiety because the hospice caregiver recommended she try something else like Zoloft.    Her stomach is doing "great" as long as she takes her medication  She applied for medicaid last week. but hasn't gotten approved yet    Relevant past medical, surgical, family and social history reviewed and updated as indicated. Interim medical history since our last visit reviewed. Allergies and medications reviewed and updated.   Current Outpatient Medications:    albuterol (VENTOLIN HFA) 108 (90 Base) MCG/ACT inhaler, INHALE 2 PUFFS BY MOUTH EVERY 6 HOURS AS NEEDED FOR COUGHING, WHEEZING, OR SHORTNESS OF BREATH, Disp: 3 each, Rfl: 0   atorvastatin (LIPITOR) 20 MG tablet, TAKE 1 Tablet BY MOUTH ONCE EVERY DAY, Disp: 90 tablet, Rfl: 0   citalopram (CELEXA) 20 MG tablet, TAKE 1 Tablet BY MOUTH ONCE EVERY DAY, Disp: 90 tablet, Rfl: 0   omeprazole (PRILOSEC) 40 MG capsule, Take 1 capsule (40 mg total) by mouth in the morning and at bedtime., Disp: 180 capsule, Rfl: 0    Review of Systems  Per HPI unless specifically indicated above     Objective:    BP 112/74   Pulse 79   Temp (!) 97.1 F (36.2 C)   SpO2 96%   Wt Readings from Last 3 Encounters:  10/16/22 155 lb 3.2 oz (70.4 kg)  07/18/22 157 lb (71.2 kg)  06/25/22 159 lb 3.2 oz (72.2 kg)    Physical Exam Vitals reviewed.  Constitutional:      General: She is not in acute  distress.    Appearance: She is well-developed. She is not toxic-appearing.  HENT:     Head: Normocephalic and atraumatic.  Cardiovascular:     Rate and Rhythm: Normal rate and regular rhythm.  Pulmonary:     Effort: Pulmonary effort is normal.     Breath sounds: Normal breath sounds.  Abdominal:     General: Bowel sounds are normal.     Palpations: Abdomen is soft. There is no mass.     Tenderness: There is no abdominal tenderness.  Musculoskeletal:     Cervical back: Neck supple.     Right lower leg: No edema.     Left lower leg: No edema.  Lymphadenopathy:     Cervical: No cervical adenopathy.  Skin:    General: Skin is warm and dry.  Neurological:     Mental Status: She is alert and oriented to person, place, and time.  Psychiatric:        Attention and Perception: Attention normal.        Behavior: Behavior normal. Behavior is cooperative.     Results for orders placed or performed during the hospital encounter of 11/28/22  Lipid panel  Result Value Ref Range   Cholesterol 216 (H) 0 - 200 mg/dL   Triglycerides  156 (H) <150 mg/dL   HDL 80 >40 mg/dL   Total CHOL/HDL Ratio 2.7 RATIO   VLDL 31 0 - 40 mg/dL   LDL Cholesterol 105 (H) 0 - 99 mg/dL  Comprehensive metabolic panel  Result Value Ref Range   Sodium 136 135 - 145 mmol/L   Potassium 4.8 3.5 - 5.1 mmol/L   Chloride 100 98 - 111 mmol/L   CO2 30 22 - 32 mmol/L   Glucose, Bld 86 70 - 99 mg/dL   BUN 10 6 - 20 mg/dL   Creatinine, Ser 0.68 0.44 - 1.00 mg/dL   Calcium 9.7 8.9 - 10.3 mg/dL   Total Protein 7.1 6.5 - 8.1 g/dL   Albumin 3.9 3.5 - 5.0 g/dL   AST 25 15 - 41 U/L   ALT 23 0 - 44 U/L   Alkaline Phosphatase 62 38 - 126 U/L   Total Bilirubin 0.7 0.3 - 1.2 mg/dL   GFR, Estimated >60 >60 mL/min   Anion gap 6 5 - 15      Assessment & Plan:    Encounter Diagnoses  Name Primary?   Hyperlipidemia, unspecified hyperlipidemia type Yes   Chronic obstructive pulmonary disease, unspecified COPD type ([place])     Tobacco use disorder    Anxiety    Gastroesophageal reflux disease, unspecified whether esophagitis present       -Reviewed labs with pt -she is to continue atorvastatin and lowfat diet for dyslipidemia -Her screening mammogram is due.  She wants to wait until her medicaid takes effect -will change to sertraline per pt request and discontinue the citalopram -pt to follow up first week of january as this will be 3 weeks on sertraline to check how she is doing on it.  She is to contact office sooner for any problems or issues

## 2022-12-03 ENCOUNTER — Other Ambulatory Visit: Payer: Self-pay | Admitting: Physician Assistant

## 2022-12-31 ENCOUNTER — Ambulatory Visit: Payer: Self-pay | Admitting: Physician Assistant

## 2023-01-13 ENCOUNTER — Other Ambulatory Visit: Payer: Self-pay | Admitting: Physician Assistant

## 2023-01-13 MED ORDER — SERTRALINE HCL 25 MG PO TABS: 25.0000 mg | ORAL_TABLET | Freq: Every day | ORAL | 0 refills | Status: DC

## 2023-01-21 ENCOUNTER — Ambulatory Visit: Payer: Self-pay | Admitting: Physician Assistant

## 2023-01-26 ENCOUNTER — Ambulatory Visit: Payer: Self-pay | Admitting: Internal Medicine

## 2023-01-26 ENCOUNTER — Ambulatory Visit: Payer: Medicaid Other | Admitting: Internal Medicine

## 2023-02-20 ENCOUNTER — Ambulatory Visit: Payer: Medicaid Other | Admitting: Family Medicine

## 2023-03-09 ENCOUNTER — Encounter: Payer: Self-pay | Admitting: Family Medicine

## 2023-03-09 ENCOUNTER — Telehealth: Payer: Self-pay | Admitting: Family Medicine

## 2023-03-09 ENCOUNTER — Ambulatory Visit (INDEPENDENT_AMBULATORY_CARE_PROVIDER_SITE_OTHER): Payer: Medicaid Other | Admitting: Family Medicine

## 2023-03-09 VITALS — BP 132/82 | HR 70 | Ht 64.0 in | Wt 157.0 lb

## 2023-03-09 DIAGNOSIS — R7301 Impaired fasting glucose: Secondary | ICD-10-CM

## 2023-03-09 DIAGNOSIS — E0789 Other specified disorders of thyroid: Secondary | ICD-10-CM

## 2023-03-09 DIAGNOSIS — E785 Hyperlipidemia, unspecified: Secondary | ICD-10-CM | POA: Diagnosis not present

## 2023-03-09 DIAGNOSIS — F17200 Nicotine dependence, unspecified, uncomplicated: Secondary | ICD-10-CM

## 2023-03-09 DIAGNOSIS — F411 Generalized anxiety disorder: Secondary | ICD-10-CM | POA: Diagnosis not present

## 2023-03-09 DIAGNOSIS — Z23 Encounter for immunization: Secondary | ICD-10-CM | POA: Diagnosis not present

## 2023-03-09 DIAGNOSIS — Z1159 Encounter for screening for other viral diseases: Secondary | ICD-10-CM

## 2023-03-09 DIAGNOSIS — E559 Vitamin D deficiency, unspecified: Secondary | ICD-10-CM | POA: Diagnosis not present

## 2023-03-09 DIAGNOSIS — Z1211 Encounter for screening for malignant neoplasm of colon: Secondary | ICD-10-CM

## 2023-03-09 DIAGNOSIS — Z114 Encounter for screening for human immunodeficiency virus [HIV]: Secondary | ICD-10-CM

## 2023-03-09 MED ORDER — HYDROXYZINE PAMOATE 25 MG PO CAPS: 25.0000 mg | ORAL_CAPSULE | Freq: Three times a day (TID) | ORAL | 1 refills | Status: DC | PRN

## 2023-03-09 MED ORDER — ATORVASTATIN CALCIUM 20 MG PO TABS: ORAL_TABLET | ORAL | 0 refills | Status: DC

## 2023-03-09 NOTE — Telephone Encounter (Signed)
error 

## 2023-03-09 NOTE — Progress Notes (Signed)
New Patient Office Visit  Subjective:  Patient ID: Denise Ford, female    DOB: [birth year]  Age: 62 y.o. MRN: [id number]  CC:  Chief Complaint  Patient presents with   New Patient (Initial Visit)    Establishing care today. Pt reports anxiety and nervousness. Needs refills on medication.     HPI Denise Ford is a 62 y.o. female with past medical history of thyroid nodule, hyperlipidemia, current smoker, dysphagia, and GERD presents for establishing care. For the details of today's visit, please refer to the assessment and plan.     Past Medical History:  Diagnosis Date   Anxiety    Hyperlipidemia    Vocal cord polyps     Past Surgical History:  Procedure Laterality Date   CESAREAN SECTION     x2   COLONOSCOPY WITH PROPOFOL N/A 07/18/2022   Procedure: COLONOSCOPY WITH PROPOFOL;  Surgeon: Daneil Dolin, MD;  [facility];  Service: Endoscopy;  Laterality: N/A;  12:45pm   ESOPHAGOGASTRODUODENOSCOPY (EGD) WITH PROPOFOL N/A 03/24/2022   Procedure: ESOPHAGOGASTRODUODENOSCOPY (EGD) WITH PROPOFOL;  Surgeon: Daneil Dolin, MD;  [facility];  Service: Endoscopy;  Laterality: N/A;  1:30pm   MALONEY DILATION N/A 03/24/2022   Procedure: Venia Minks DILATION;  Surgeon: Daneil Dolin, MD;  [facility];  Service: Endoscopy;  Laterality: N/A;   POLYPECTOMY  07/18/2022   Procedure: POLYPECTOMY;  Surgeon: Daneil Dolin, MD;  [facility];  Service: Endoscopy;;   TUBAL LIGATION      Family History  Problem Relation Age of Onset   Hypertension Mother    COPD Mother    Cancer Mother        thoat cancer   Hypertension Father    Cancer Paternal Aunt        pancreatic cancer   Cancer Paternal Uncle    Colon cancer Neg Hx     Social History   Socioeconomic History   Marital status: Married    Spouse name: Not on file   Number of children: Not on file   Years of education: Not on file   Highest education level: Not on file   Occupational History   Not on file  Tobacco Use   Smoking status: Every Day    Packs/day: 0.50    Years: 30.00    Total pack years: 15.00    Types: Cigarettes   Smokeless tobacco: Never  Vaping Use   Vaping Use: Never used  Substance and Sexual Activity   Alcohol use: Not Currently    Comment: prior heavy etoh use/ seldom   Drug use: Yes    Frequency: 2.0 times per week    Types: Marijuana    Comment: occ; last used 03/23/22   Sexual activity: Not Currently  Other Topics Concern   Not on file  Social History Narrative   Not on file   Social Determinants of Health   Financial Resource Strain: Not on file  Food Insecurity: Not on file  Transportation Needs: Not on file  Physical Activity: Not on file  Stress: Not on file  Social Connections: Not on file  Intimate Partner Violence: Not on file    ROS Review of Systems  Constitutional:  Negative for chills and fever.  Eyes:  Negative for visual disturbance.  Respiratory:  Negative for chest tightness and shortness of breath.   Neurological:  Negative for dizziness and headaches.    Objective:   Today's Vitals:  BP 132/82 (BP Location: Left Arm)   Pulse 70   Ht '5\' 4"'$  (1.626 m)   Wt 157 lb 0.6 oz (71.2 kg)   SpO2 94%   BMI 26.96 kg/m   Physical Exam HENT:     Head: Normocephalic.     Mouth/Throat:     Mouth: Mucous membranes are moist.  Cardiovascular:     Rate and Rhythm: Normal rate.     Heart sounds: Normal heart sounds.  Pulmonary:     Effort: Pulmonary effort is normal.     Breath sounds: Normal breath sounds.  Neurological:     Mental Status: She is alert.      Assessment & Plan:   GAD (generalized anxiety disorder) Assessment & Plan: Denies suicidal thoughts and ideation She reports increased anxiety,noting increased worrying She noted sleep disturbances, noting difficulty falling and staying asleep She is interested in talking with a counselor Referral placed We will start patient on  hydralazine 25 mg every 8 hours as needed for anxiety and sleep disturbance We will follow up in 6 weeks  Orders: -     hydrOXYzine Pamoate; Take 1 capsule (25 mg total) by mouth every 8 (eight) hours as needed.  Dispense: 30 capsule; Refill: 1 -     Ambulatory referral to Psychiatry  Current smoker Assessment & Plan: Smokes about 1 pack/day  Asked about quitting: confirms that she currently smokes cigarettes Advise to quit smoking: Educated about QUITTING to reduce the risk of cancer, cardio and cerebrovascular disease. Assess willingness: Unwilling to quit at this time, but is working on cutting back. Assist with counseling and pharmacotherapy: Counseled for 5 minutes and literature provided. Arrange for follow up: follow up in 3 months and continue to offer help.    Hyperlipidemia, unspecified hyperlipidemia type -     CBC with Differential/Platelet -     CMP14+EGFR -     Lipid panel -     Atorvastatin Calcium; Take 1 tablet by mouth daily once a day.  Dispense: 90 tablet; Refill: 0  Vitamin D deficiency -     VITAMIN D 25 Hydroxy (Vit-D Deficiency, Fractures)  Other specified disorders of thyroid -     TSH + free T4  Impaired fasting blood sugar -     Hemoglobin A1c  Encounter for hepatitis C screening test for low risk patient -     Hepatitis C antibody  Immunization due -     Varicella-zoster vaccine IM -     Tdap vaccine greater than or equal to 7yo IM  Colon cancer screening  Encounter for screening for HIV -     HIV Antibody (routine testing w rflx)     Follow-up: Return in about 6 weeks (around 04/20/2023), or anxiety.   Alvira Monday, FNP

## 2023-03-09 NOTE — Patient Instructions (Addendum)
I appreciate the opportunity to provide care to you today!    Follow up:  6 weeks for anxiety  Labs: please stop by the lab today to get your blood drawn (CBC, CMP, TSH, Lipid profile, HgA1c, Vit D)  Screening: HIV and Hep C  Anxiety Please start taking hydroxyzine 25 mg every 8 hours as needed for anxiety  Smoking is harmful to your health and increases your risk for cancer, COPD, high blood pressure, cataracts, digestive problems, or health problems , such as gum disease, mouth sores, and tooth loss and loss of taste and smell. Smoking irritates your throat and causes coughing.     Please continue to a heart-healthy diet and increase your physical activities. Try to exercise for 39mns at least five times a week.      It was a pleasure to see you and I look forward to continuing to work together on your health and well-being. Please do not hesitate to call the office if you need care or have questions about your care.   Have a wonderful day and week. With Gratitude, GAlvira MondayMSN, FNP-BC

## 2023-03-09 NOTE — Assessment & Plan Note (Addendum)
Denies suicidal thoughts and ideation She reports increased anxiety,noting increased worrying She noted sleep disturbances, noting difficulty falling and staying asleep She is interested in talking with a counselor Referral placed We will start patient on hydralazine 25 mg every 8 hours as needed for anxiety and sleep disturbance We will follow up in 6 weeks

## 2023-03-09 NOTE — Assessment & Plan Note (Signed)
Smokes about 1 pack/day  Asked about quitting: confirms that she currently smokes cigarettes Advise to quit smoking: Educated about QUITTING to reduce the risk of cancer, cardio and cerebrovascular disease. Assess willingness: Unwilling to quit at this time, but is working on cutting back. Assist with counseling and pharmacotherapy: Counseled for 5 minutes and literature provided. Arrange for follow up: follow up in 3 months and continue to offer help.  

## 2023-03-16 DIAGNOSIS — E785 Hyperlipidemia, unspecified: Secondary | ICD-10-CM | POA: Diagnosis not present

## 2023-03-16 DIAGNOSIS — E559 Vitamin D deficiency, unspecified: Secondary | ICD-10-CM | POA: Diagnosis not present

## 2023-03-16 DIAGNOSIS — E0789 Other specified disorders of thyroid: Secondary | ICD-10-CM | POA: Diagnosis not present

## 2023-03-16 DIAGNOSIS — Z1159 Encounter for screening for other viral diseases: Secondary | ICD-10-CM | POA: Diagnosis not present

## 2023-03-16 DIAGNOSIS — R7301 Impaired fasting glucose: Secondary | ICD-10-CM | POA: Diagnosis not present

## 2023-03-17 ENCOUNTER — Other Ambulatory Visit: Payer: Self-pay | Admitting: Family Medicine

## 2023-03-17 DIAGNOSIS — E559 Vitamin D deficiency, unspecified: Secondary | ICD-10-CM

## 2023-03-17 LAB — CBC WITH DIFFERENTIAL/PLATELET
Basophils Absolute: 0.1 10*3/uL (ref 0.0–0.2)
Basos: 1 %
EOS (ABSOLUTE): 0.1 10*3/uL (ref 0.0–0.4)
Eos: 1 %
Hematocrit: 42.7 % (ref 34.0–46.6)
Hemoglobin: 14.8 g/dL (ref 11.1–15.9)
Immature Grans (Abs): 0 10*3/uL (ref 0.0–0.1)
Immature Granulocytes: 0 %
Lymphocytes Absolute: 3.9 10*3/uL — ABNORMAL HIGH (ref 0.7–3.1)
Lymphs: 47 %
MCH: 32.9 pg (ref 26.6–33.0)
MCHC: 34.7 g/dL (ref 31.5–35.7)
MCV: 95 fL (ref 79–97)
Monocytes Absolute: 0.6 10*3/uL (ref 0.1–0.9)
Monocytes: 7 %
Neutrophils Absolute: 3.6 10*3/uL (ref 1.4–7.0)
Neutrophils: 44 %
Platelets: 307 10*3/uL (ref 150–450)
RBC: 4.5 x10E6/uL (ref 3.77–5.28)
RDW: 12.6 % (ref 11.7–15.4)
WBC: 8.2 10*3/uL (ref 3.4–10.8)

## 2023-03-17 LAB — TSH+FREE T4
Free T4: 1.44 ng/dL (ref 0.82–1.77)
TSH: 2.51 u[IU]/mL (ref 0.450–4.500)

## 2023-03-17 LAB — LIPID PANEL
Chol/HDL Ratio: 2.9 ratio (ref 0.0–4.4)
Cholesterol, Total: 171 mg/dL (ref 100–199)
HDL: 59 mg/dL (ref >39)
LDL Chol Calc (NIH): 86 mg/dL (ref 0–99)
Triglycerides: 152 mg/dL — ABNORMAL HIGH (ref 0–149)
VLDL Cholesterol Cal: 26 mg/dL (ref 5–40)

## 2023-03-17 LAB — CMP14+EGFR
ALT: 15 IU/L (ref 0–32)
AST: 19 IU/L (ref 0–40)
Albumin/Globulin Ratio: 2.1 (ref 1.2–2.2)
Albumin: 4.4 g/dL (ref 3.8–4.9)
Alkaline Phosphatase: 78 IU/L (ref 44–121)
BUN/Creatinine Ratio: 14 (ref 12–28)
BUN: 11 mg/dL (ref 8–27)
Bilirubin Total: 0.4 mg/dL (ref 0.0–1.2)
CO2: 25 mmol/L (ref 20–29)
Calcium: 10.3 mg/dL (ref 8.7–10.3)
Chloride: 103 mmol/L (ref 96–106)
Creatinine, Ser: 0.81 mg/dL (ref 0.57–1.00)
Globulin, Total: 2.1 g/dL (ref 1.5–4.5)
Glucose: 98 mg/dL (ref 70–99)
Potassium: 4.1 mmol/L (ref 3.5–5.2)
Sodium: 142 mmol/L (ref 134–144)
Total Protein: 6.5 g/dL (ref 6.0–8.5)
eGFR: 83 mL/min/{1.73_m2} (ref >59)

## 2023-03-17 LAB — HEMOGLOBIN A1C
Est. average glucose Bld gHb Est-mCnc: 108 mg/dL
Hgb A1c MFr Bld: 5.4 % (ref 4.8–5.6)

## 2023-03-17 LAB — HEPATITIS C ANTIBODY: Hep C Virus Ab: NONREACTIVE

## 2023-03-17 LAB — VITAMIN D 25 HYDROXY (VIT D DEFICIENCY, FRACTURES): Vit D, 25-Hydroxy: 10.6 ng/mL — ABNORMAL LOW (ref 30.0–100.0)

## 2023-03-17 MED ORDER — VITAMIN D (ERGOCALCIFEROL) 1.25 MG (50000 UNIT) PO CAPS: 50000.0000 [IU] | ORAL_CAPSULE | ORAL | 1 refills | Status: DC

## 2023-03-17 NOTE — Progress Notes (Signed)
Your triglyceride level is slightly elevated, I recommend taking over-the-counter fish oil 2000 mg twice daily. I recommend avoiding simple carbohydrates, including cakes, sweet desserts, ice cream, soda (diet or regular), sweet tea, candies, chips, cookies, store-bought juices, alcohol in excess of 1-2 drinks a day, lemonade, artificial sweeteners, donuts, coffee creamers, and sugar-free products.  I recommend avoiding greasy, fatty foods with increased physical activity. A weekly vitamin D supplement prescription has been sent to your pharmacy because your vitamin D is low.

## 2023-03-18 ENCOUNTER — Ambulatory Visit (INDEPENDENT_AMBULATORY_CARE_PROVIDER_SITE_OTHER): Payer: Medicaid Other | Admitting: Licensed Clinical Social Worker

## 2023-03-18 DIAGNOSIS — F411 Generalized anxiety disorder: Secondary | ICD-10-CM

## 2023-03-20 NOTE — BH Specialist Note (Signed)
Integrated Behavioral Health via Telemedicine Visit  03/20/2023 OCCIE DELUCIA [id number]  Number of [name] Clinician visits: 1 Session Start time: 11am  Session End time: 11:32am Total time in minutes: 32 mins via phone per pt request   Referring Provider: Verne Grain NP  Patient/Family location: Home  [facility] Provider location: [place] All persons participating in visit: Pt C Denise Ford and Denise Ford  Types of Service: Individual psychotherapy and Video visit  I connected with Denise Ford and/or Denise Ford n/a via  Telephone or Video Enabled Telemedicine Application  (Video is Caregility application) and verified that I am speaking with the correct person using two identifiers. Discussed confidentiality: Yes   I discussed the limitations of telemedicine and the availability of in person appointments.  Discussed there is a possibility of technology failure and discussed alternative modes of communication if that failure occurs.  I discussed that engaging in this telemedicine visit, they consent to the provision of behavioral healthcare and the services will be billed under their insurance.  Patient and/or legal guardian expressed understanding and consented to Telemedicine visit: Yes   Presenting Concerns: Patient and/or family reports the following symptoms/concerns: general anxiety disorder Duration of problem: over one year ; Severity of problem: mild  Patient and/or Family's Strengths/Protective Factors: Concrete supports in place (healthy food, safe environments, etc.)  Goals Addressed: Patient will:  Reduce symptoms of: anxiety   Increase knowledge and/or ability of: coping skills and healthy habits   Demonstrate ability to: Increase healthy adjustment to current life circumstances  Progress towards Goals: Ongoing  Interventions: Interventions utilized:  Motivational Interviewing, Supportive Counseling, and Link to [employer] Standardized Assessments completed: Not Needed  Patient and/or Family Response: Denise Ford reports worry, anxious mood, social isolation and depression. Denise Ford is spouse only caretaker and does not have social or family support.   Assessment: Patient currently experiencing general anxiety disorder .   Patient may benefit from caregiver support group, respite care, healthy habits, self care and therapy.  Plan: Follow up with behavioral health clinician on : as needed  Behavioral recommendations: Recommended various coping skills Denise Ford not interested reports feeling guilty when she leave spouse's side to engage in self care  Referral(s): [place] (In Clinic)  I discussed the assessment and treatment plan with the patient and/or parent/guardian. They were provided an opportunity to ask questions and all were answered. They agreed with the plan and demonstrated an understanding of the instructions.   They were advised to call back or seek an in-person evaluation if the symptoms worsen or if the condition fails to improve as anticipated.  Lynnea Ferrier, LCSW

## 2023-03-24 ENCOUNTER — Telehealth: Payer: Self-pay | Admitting: Family Medicine

## 2023-04-20 ENCOUNTER — Encounter: Payer: Self-pay | Admitting: Family Medicine

## 2023-04-20 ENCOUNTER — Ambulatory Visit (INDEPENDENT_AMBULATORY_CARE_PROVIDER_SITE_OTHER): Payer: Medicaid Other | Admitting: Family Medicine

## 2023-04-20 VITALS — BP 122/78 | HR 89 | Ht 64.0 in | Wt 166.1 lb

## 2023-04-20 DIAGNOSIS — F411 Generalized anxiety disorder: Secondary | ICD-10-CM

## 2023-04-20 DIAGNOSIS — G479 Sleep disorder, unspecified: Secondary | ICD-10-CM

## 2023-04-20 DIAGNOSIS — Z1231 Encounter for screening mammogram for malignant neoplasm of breast: Secondary | ICD-10-CM | POA: Diagnosis not present

## 2023-04-20 MED ORDER — HYDROXYZINE PAMOATE 25 MG PO CAPS: 25.0000 mg | ORAL_CAPSULE | Freq: Three times a day (TID) | ORAL | 1 refills | Status: DC | PRN

## 2023-04-20 MED ORDER — BUSPIRONE HCL 5 MG PO TABS: 5.0000 mg | ORAL_TABLET | Freq: Two times a day (BID) | ORAL | 0 refills | Status: DC

## 2023-04-20 NOTE — Assessment & Plan Note (Addendum)
Denies suicidal thoughts and ideation Reports minimal relief of her symptoms with hydroxyzine 25 mg We will start the patient on BuSpar 5 mg twice daily Encouraged to continue taking hydroxyzine 25 mg nightly for sleep Will follow-up in 6 weeks

## 2023-04-20 NOTE — Progress Notes (Signed)
Established Patient Office Visit  Subjective:  Patient ID: Denise Ford, female    DOB: [birth year]  Age: 62 y.o. MRN: [id number]  CC:  Chief Complaint  Patient presents with   Follow-up    Pt reports hydroxyzine not helping all it does is make her drowsy throughout the day.    HPI Denise Ford is a 62 y.o. female with past medical history of generalized anxiety disorder presents for 6 weeks follow-up.  For the details of today's visit, please refer to the assessment and plan.     Past Medical History:  Diagnosis Date   Anxiety    Hyperlipidemia    Vocal cord polyps     Past Surgical History:  Procedure Laterality Date   CESAREAN SECTION     x2   COLONOSCOPY WITH PROPOFOL N/A 07/18/2022   Procedure: COLONOSCOPY WITH PROPOFOL;  Surgeon: Corbin Ade, MD;  [facility];  Service: Endoscopy;  Laterality: N/A;  12:45pm   ESOPHAGOGASTRODUODENOSCOPY (EGD) WITH PROPOFOL N/A 03/24/2022   Procedure: ESOPHAGOGASTRODUODENOSCOPY (EGD) WITH PROPOFOL;  Surgeon: Corbin Ade, MD;  [facility];  Service: Endoscopy;  Laterality: N/A;  1:30pm   MALONEY DILATION N/A 03/24/2022   Procedure: Elease Hashimoto DILATION;  Surgeon: Corbin Ade, MD;  [facility];  Service: Endoscopy;  Laterality: N/A;   POLYPECTOMY  07/18/2022   Procedure: POLYPECTOMY;  Surgeon: Corbin Ade, MD;  [facility];  Service: Endoscopy;;   TUBAL LIGATION      Family History  Problem Relation Age of Onset   Hypertension Mother    COPD Mother    Cancer Mother        thoat cancer   Hypertension Father    Cancer Paternal Aunt        pancreatic cancer   Cancer Paternal Uncle    Colon cancer Neg Hx     Social History   Socioeconomic History   Marital status: Married    Spouse name: Not on file   Number of children: Not on file   Years of education: Not on file   Highest education level: Not on file  Occupational History   Not on file  Tobacco Use    Smoking status: Every Day    Packs/day: 0.50    Years: 30.00    Additional pack years: 0.00    Total pack years: 15.00    Types: Cigarettes   Smokeless tobacco: Never  Vaping Use   Vaping Use: Never used  Substance and Sexual Activity   Alcohol use: Not Currently    Comment: prior heavy etoh use/ seldom   Drug use: Yes    Frequency: 2.0 times per week    Types: Marijuana    Comment: occ; last used 03/23/22   Sexual activity: Not Currently  Other Topics Concern   Not on file  Social History Narrative   Not on file   Social Determinants of Health   Financial Resource Strain: Not on file  Food Insecurity: Not on file  Transportation Needs: Not on file  Physical Activity: Not on file  Stress: Not on file  Social Connections: Not on file  Intimate Partner Violence: Not on file    Outpatient Medications Prior to Visit  Medication Sig Dispense Refill   albuterol (VENTOLIN HFA) 108 (90 Base) MCG/ACT inhaler INHALE 2 PUFFS BY MOUTH EVERY 6 HOURS AS NEEDED FOR COUGHING, WHEEZING, OR SHORTNESS OF BREATH 20.1 g 0   atorvastatin (LIPITOR)  20 MG tablet Take 1 tablet by mouth daily once a day. 90 tablet 0   omeprazole (PRILOSEC) 40 MG capsule TAKE 1 Capsule  BY MOUTH TWICE DAILY (MORNING AND AT BEDTIME) 180 capsule 0   Vitamin D, Ergocalciferol, (DRISDOL) 1.25 MG (50000 UNIT) CAPS capsule Take 1 capsule (50,000 Units total) by mouth every 7 (seven) days. 10 capsule 1   hydrOXYzine (VISTARIL) 25 MG capsule Take 1 capsule (25 mg total) by mouth every 8 (eight) hours as needed. 30 capsule 1   sertraline (ZOLOFT) 25 MG tablet Take 1 tablet (25 mg total) by mouth daily. (Patient not taking: Reported on 04/20/2023) 30 tablet 0   No facility-administered medications prior to visit.    No Known Allergies  ROS Review of Systems  Constitutional:  Negative for chills and fever.  Eyes:  Negative for visual disturbance.  Respiratory:  Negative for chest tightness and shortness of breath.    Neurological:  Negative for dizziness and headaches.  Psychiatric/Behavioral:  Positive for sleep disturbance.       Objective:    Physical Exam HENT:     Head: Normocephalic.     Mouth/Throat:     Mouth: Mucous membranes are moist.  Cardiovascular:     Rate and Rhythm: Normal rate.     Heart sounds: Normal heart sounds.  Pulmonary:     Effort: Pulmonary effort is normal.     Breath sounds: Normal breath sounds.  Neurological:     Mental Status: She is alert.     BP 122/78   Pulse 89   Ht  (1.626 m)   Wt 166 lb 1.9 oz (75.4 kg)   SpO2 96%   BMI 28.51 kg/m  Wt Readings from Last 3 Encounters:  04/20/23 166 lb 1.9 oz (75.4 kg)  03/09/23 157 lb 0.6 oz (71.2 kg)  10/16/22 155 lb 3.2 oz (70.4 kg)    Lab Results  Component Value Date   TSH 2.510 03/16/2023   Lab Results  Component Value Date   WBC 8.2 03/16/2023   HGB 14.8 03/16/2023   HCT 42.7 03/16/2023   MCV 95 03/16/2023   PLT 307 03/16/2023   Lab Results  Component Value Date   NA 142 03/16/2023   K 4.1 03/16/2023   CO2 25 03/16/2023   GLUCOSE 98 03/16/2023   BUN 11 03/16/2023   CREATININE 0.81 03/16/2023   BILITOT 0.4 03/16/2023   ALKPHOS 78 03/16/2023   AST 19 03/16/2023   ALT 15 03/16/2023   PROT 6.5 03/16/2023   ALBUMIN 4.4 03/16/2023   CALCIUM 10.3 03/16/2023   ANIONGAP 6 11/28/2022   EGFR 83 03/16/2023   Lab Results  Component Value Date   CHOL 171 03/16/2023   Lab Results  Component Value Date   HDL 59 03/16/2023   Lab Results  Component Value Date   LDLCALC 86 03/16/2023   Lab Results  Component Value Date   TRIG 152 (H) 03/16/2023   Lab Results  Component Value Date   CHOLHDL 2.9 03/16/2023   Lab Results  Component Value Date   HGBA1C 5.4 03/16/2023      Assessment & Plan:  GAD (generalized anxiety disorder) Assessment & Plan: Denies suicidal thoughts and ideation Reports minimal relief of her symptoms with hydroxyzine 25 mg We will start the patient on  BuSpar 5 mg twice daily Encouraged to continue taking hydroxyzine 25 mg nightly for sleep Will follow-up in 6 weeks  Orders: -     busPIRone  HCl; Take 1 tablet (5 mg total) by mouth 2 (two) times daily.  Dispense: 60 tablet; Refill: 0  Sleep disturbance -     hydrOXYzine Pamoate; Take 1 capsule (25 mg total) by mouth every 8 (eight) hours as needed.  Dispense: 30 capsule; Refill: 1  Breast cancer screening by mammogram -     3D Screening Mammogram, Left and Right    Follow-up: Return in about 6 weeks (around 06/01/2023).   Gilmore Laroche, FNP

## 2023-04-20 NOTE — Patient Instructions (Addendum)
I appreciate the opportunity to provide care to you today!    Follow up:  6 weeks for GAD   Please pick up your prescription at the pharmacy  Buspar Taking as often as directed is critical for effectiveness Take doses consistently, either with or without food May take at least 2 weeks for benefit, and 4-8 weeks for full effect Avoid drinking large amounts of grapefruit juice while on this medication Do not drink alcohol while using this medication  Schedule mammogram  Please continue to a heart-healthy diet and increase your physical activities. Try to exercise for at least five days a week.      It was a pleasure to see you and I look forward to continuing to work together on your health and well-being. Please do not hesitate to call the office if you need care or have questions about your care.   Have a wonderful day and week. With Gratitude, Gilmore Laroche MSN, FNP-BC

## 2023-05-20 ENCOUNTER — Telehealth: Payer: Self-pay | Admitting: Family Medicine

## 2023-05-20 ENCOUNTER — Other Ambulatory Visit: Payer: Self-pay

## 2023-05-20 ENCOUNTER — Other Ambulatory Visit: Payer: Self-pay | Admitting: Family Medicine

## 2023-05-20 DIAGNOSIS — K2100 Gastro-esophageal reflux disease with esophagitis, without bleeding: Secondary | ICD-10-CM

## 2023-05-20 DIAGNOSIS — F411 Generalized anxiety disorder: Secondary | ICD-10-CM

## 2023-05-20 MED ORDER — OMEPRAZOLE 40 MG PO CPDR: 40.0000 mg | DELAYED_RELEASE_CAPSULE | Freq: Every day | ORAL | 2 refills | Status: DC

## 2023-05-20 NOTE — Telephone Encounter (Signed)
  Patient called need prescription for this medicine no longer get from other practice  omeprazole (PRILOSEC) 40 MG capsule [[id number]]   [facility] [place]

## 2023-05-20 NOTE — Telephone Encounter (Signed)
Refill sent.

## 2023-05-25 ENCOUNTER — Other Ambulatory Visit: Payer: Self-pay | Admitting: Family Medicine

## 2023-05-25 DIAGNOSIS — G479 Sleep disorder, unspecified: Secondary | ICD-10-CM

## 2023-06-01 ENCOUNTER — Encounter: Payer: Self-pay | Admitting: Family Medicine

## 2023-06-01 ENCOUNTER — Ambulatory Visit (INDEPENDENT_AMBULATORY_CARE_PROVIDER_SITE_OTHER): Payer: Medicaid Other | Admitting: Family Medicine

## 2023-06-01 VITALS — BP 123/85 | HR 81 | Wt 157.1 lb

## 2023-06-01 DIAGNOSIS — R29898 Other symptoms and signs involving the musculoskeletal system: Secondary | ICD-10-CM | POA: Diagnosis not present

## 2023-06-01 DIAGNOSIS — Z23 Encounter for immunization: Secondary | ICD-10-CM | POA: Diagnosis not present

## 2023-06-01 DIAGNOSIS — Z122 Encounter for screening for malignant neoplasm of respiratory organs: Secondary | ICD-10-CM

## 2023-06-01 DIAGNOSIS — F17200 Nicotine dependence, unspecified, uncomplicated: Secondary | ICD-10-CM | POA: Diagnosis not present

## 2023-06-01 DIAGNOSIS — F411 Generalized anxiety disorder: Secondary | ICD-10-CM

## 2023-06-01 DIAGNOSIS — E538 Deficiency of other specified B group vitamins: Secondary | ICD-10-CM | POA: Diagnosis not present

## 2023-06-01 DIAGNOSIS — F339 Major depressive disorder, recurrent, unspecified: Secondary | ICD-10-CM | POA: Diagnosis not present

## 2023-06-01 MED ORDER — SERTRALINE HCL 50 MG PO TABS
50.0000 mg | ORAL_TABLET | Freq: Every day | ORAL | 3 refills | Status: AC
Start: 2023-06-01 — End: ?

## 2023-06-01 MED ORDER — VITAMIN B-12 1000 MCG PO TABS
1000.0000 ug | ORAL_TABLET | Freq: Every day | ORAL | 0 refills | Status: AC
Start: 2023-06-01 — End: ?

## 2023-06-01 MED ORDER — NICOTINE 21 MG/24HR TD PT24
21.0000 mg | MEDICATED_PATCH | Freq: Every day | TRANSDERMAL | 0 refills | Status: AC
Start: 2023-06-01 — End: 2023-07-01

## 2023-06-01 NOTE — Assessment & Plan Note (Signed)
Reports minimal relief with BuSpar 5 mg twice daily PHQ-9 is 12 GAD-7 is 10 We will discontinue BuSpar 5 mg twice daily and start the patient on Zoloft 50 mg daily to treat anxiety and depression Reports that her daughter and her 3 children is currently living with her which she has increased her anxiety further She declines referral to a therapist today Denies suicidal thoughts and ideation

## 2023-06-01 NOTE — Assessment & Plan Note (Signed)
Reports numbness and tingling at the tip of her pinky and right pinky finger of the right hand Decreased handgrip in the right hand Onset of symptoms for about a week, she reports not being able to open the jar with the right hand due to decreased grip strength No recent injury or trauma Encouraged to start taking vitamin B12 at 1000 mcg daily to help with numbness and tingling Encouraged to perform grip strengthening exercises as noted in the clinical reference We will place a referral to orthopedic surgery for further evaluation

## 2023-06-01 NOTE — Patient Instructions (Addendum)
I appreciate the opportunity to provide care to you today!    Follow up:  6 weeks  Labs: next visit   Please start taking Zoloft 50 mg daily  Take at the same time each day, with or without food May take at least 2 weeks for benefit, and 6-8 weeks for full effect Do not abruptly discontinue to avoid withdrawal symptoms    Please refer to the clinica reference for grip strengthening exercises  Referrals today- orthopedic surgery   Please continue to a heart-healthy diet and increase your physical activities. Try to exercise for at least five days a week.      It was a pleasure to see you and I look forward to continuing to work together on your health and well-being. Please do not hesitate to call the office if you need care or have questions about your care.   Have a wonderful day and week. With Gratitude, Gilmore Laroche MSN, FNP-BC

## 2023-06-01 NOTE — Assessment & Plan Note (Addendum)
She reports smoking since the age of 51, 1 pack of cigarette daily She would like to quit and is interested in the nicotine patch Prescription sent to the pharmacy Congratulate the patient willingness to quit smoking Referral placed to pulmonary for low-dose lung cancer screening given the patient history and age

## 2023-06-01 NOTE — Progress Notes (Signed)
Established Patient Office Visit  Subjective:  Patient ID: Denise Ford, female    DOB: August 22, 1961  Age: 62 y.o. MRN: [id number]  CC:  Chief Complaint  Patient presents with   Chronic Care Management    6 week f/u for anxiety/depression, pt reports unable to sleep at night and anxious more than usual.     HPI Denise Ford is a 62 y.o. female with past medical history of hyperlipidemia, current smoker anxiety and depression presents for anxiety follow-up. For the details of today's visit, please refer to the assessment and plan.    Past Medical History:  Diagnosis Date   Anxiety    Hyperlipidemia    Vocal cord polyps     Past Surgical History:  Procedure Laterality Date   CESAREAN SECTION     x2   COLONOSCOPY WITH PROPOFOL N/A 07/18/2022   Procedure: COLONOSCOPY WITH PROPOFOL;  Surgeon: Corbin Ade, MD;  [facility];  Service: Endoscopy;  Laterality: N/A;  12:45pm   ESOPHAGOGASTRODUODENOSCOPY (EGD) WITH PROPOFOL N/A 03/24/2022   Procedure: ESOPHAGOGASTRODUODENOSCOPY (EGD) WITH PROPOFOL;  Surgeon: Corbin Ade, MD;  [facility];  Service: Endoscopy;  Laterality: N/A;  1:30pm   MALONEY DILATION N/A 03/24/2022   Procedure: Elease Hashimoto DILATION;  Surgeon: Corbin Ade, MD;  [facility];  Service: Endoscopy;  Laterality: N/A;   POLYPECTOMY  07/18/2022   Procedure: POLYPECTOMY;  Surgeon: Corbin Ade, MD;  [facility];  Service: Endoscopy;;   TUBAL LIGATION      Family History  Problem Relation Age of Onset   Hypertension Mother    COPD Mother    Cancer Mother        thoat cancer   Hypertension Father    Cancer Paternal Aunt        pancreatic cancer   Cancer Paternal Uncle    Colon cancer Neg Hx     Social History   Socioeconomic History   Marital status: Married    Spouse name: Not on file   Number of children: Not on file   Years of education: Not on file   Highest education level: Not on file   Occupational History   Not on file  Tobacco Use   Smoking status: Every Day    Packs/day: 0.50    Years: 30.00    Additional pack years: 0.00    Total pack years: 15.00    Types: Cigarettes   Smokeless tobacco: Never  Vaping Use   Vaping Use: Never used  Substance and Sexual Activity   Alcohol use: Not Currently    Comment: prior heavy etoh use/ seldom   Drug use: Yes    Frequency: 2.0 times per week    Types: Marijuana    Comment: occ; last used 03/23/22   Sexual activity: Not Currently  Other Topics Concern   Not on file  Social History Narrative   Not on file   Social Determinants of Health   Financial Resource Strain: Not on file  Food Insecurity: Not on file  Transportation Needs: Not on file  Physical Activity: Not on file  Stress: Not on file  Social Connections: Not on file  Intimate Partner Violence: Not on file    Outpatient Medications Prior to Visit  Medication Sig Dispense Refill   albuterol (VENTOLIN HFA) 108 (90 Base) MCG/ACT inhaler INHALE 2 PUFFS BY MOUTH EVERY 6 HOURS AS NEEDED FOR COUGHING, WHEEZING, OR SHORTNESS OF BREATH 20.1 g  0   atorvastatin (LIPITOR) 20 MG tablet Take 1 tablet by mouth daily once a day. 90 tablet 0   hydrOXYzine (VISTARIL) 25 MG capsule TAKE 1 CAPSULE BY MOUTH EVERY 8 HOURS AS NEEDED 30 capsule 0   omeprazole (PRILOSEC) 40 MG capsule Take 1 capsule (40 mg total) by mouth daily. 30 capsule 2   Vitamin D, Ergocalciferol, (DRISDOL) 1.25 MG (50000 UNIT) CAPS capsule Take 1 capsule (50,000 Units total) by mouth every 7 (seven) days. 10 capsule 1   busPIRone (BUSPAR) 5 MG tablet Take 1 tablet by mouth twice daily 60 tablet 0   sertraline (ZOLOFT) 25 MG tablet Take 1 tablet (25 mg total) by mouth daily. 30 tablet 0   No facility-administered medications prior to visit.    No Known Allergies  ROS Review of Systems  Constitutional:  Negative for chills and fever.  Eyes:  Negative for visual disturbance.  Respiratory:  Negative  for chest tightness and shortness of breath.   Musculoskeletal:        Decreased hand grip in the right hand  Neurological:  Negative for dizziness and headaches.      Objective:    Physical Exam HENT:     Head: Normocephalic.     Mouth/Throat:     Mouth: Mucous membranes are moist.  Cardiovascular:     Rate and Rhythm: Normal rate.     Heart sounds: Normal heart sounds.  Pulmonary:     Effort: Pulmonary effort is normal.     Breath sounds: Normal breath sounds.  Musculoskeletal:     Comments: Decreased handgrip in the right hand  Range of motion is intact in the right hand Sensation is intact in the right hand Neurovascularly intact in the right hand   Neurological:     Mental Status: She is alert.     BP 123/85   Pulse 81   Wt 157 lb 1.3 oz (71.3 kg)   SpO2 97%   BMI 26.96 kg/m  Wt Readings from Last 3 Encounters:  06/01/23 157 lb 1.3 oz (71.3 kg)  04/20/23 166 lb 1.9 oz (75.4 kg)  03/09/23 157 lb 0.6 oz (71.2 kg)    Lab Results  Component Value Date   TSH 2.510 03/16/2023   Lab Results  Component Value Date   WBC 8.2 03/16/2023   HGB 14.8 03/16/2023   HCT 42.7 03/16/2023   MCV 95 03/16/2023   PLT 307 03/16/2023   Lab Results  Component Value Date   NA 142 03/16/2023   K 4.1 03/16/2023   CO2 25 03/16/2023   GLUCOSE 98 03/16/2023   BUN 11 03/16/2023   CREATININE 0.81 03/16/2023   BILITOT 0.4 03/16/2023   ALKPHOS 78 03/16/2023   AST 19 03/16/2023   ALT 15 03/16/2023   PROT 6.5 03/16/2023   ALBUMIN 4.4 03/16/2023   CALCIUM 10.3 03/16/2023   ANIONGAP 6 11/28/2022   EGFR 83 03/16/2023   Lab Results  Component Value Date   CHOL 171 03/16/2023   Lab Results  Component Value Date   HDL 59 03/16/2023   Lab Results  Component Value Date   LDLCALC 86 03/16/2023   Lab Results  Component Value Date   TRIG 152 (H) 03/16/2023   Lab Results  Component Value Date   CHOLHDL 2.9 03/16/2023   Lab Results  Component Value Date   HGBA1C 5.4  03/16/2023      Assessment & Plan:  GAD (generalized anxiety disorder) Assessment & Plan: Reports minimal  relief with BuSpar 5 mg twice daily PHQ-9 is 12 GAD-7 is 10 We will discontinue BuSpar 5 mg twice daily and start the patient on Zoloft 50 mg daily to treat anxiety and depression Reports that her daughter and her 3 children is currently living with her which she has increased her anxiety further She declines referral to a therapist today Denies suicidal thoughts and ideation  Orders: -     Sertraline HCl; Take 1 tablet (50 mg total) by mouth daily.  Dispense: 30 tablet; Refill: 3  Current smoker Assessment & Plan: She reports smoking since the age of 16, 1 pack of cigarette daily She would like to quit and is interested in the nicotine patch Prescription sent to the pharmacy Congratulate the patient willingness to quit smoking Referral placed to pulmonary for low-dose lung cancer screening given the patient history and age  Orders: -     Nicotine; Place 1 patch (21 mg total) onto the skin daily.  Dispense: 30 patch; Refill: 0  Decreased grip strength of right hand Assessment & Plan: Reports numbness and tingling at the tip of her pinky and right pinky finger of the right hand Decreased handgrip in the right hand Onset of symptoms for about a week, she reports not being able to open the jar with the right hand due to decreased grip strength No recent injury or trauma Encouraged to start taking vitamin B12 at 1000 mcg daily to help with numbness and tingling Encouraged to perform grip strengthening exercises as noted in the clinical reference We will place a referral to orthopedic surgery for further evaluation  Orders: -     Ambulatory referral to Orthopedic Surgery  Immunization due -     Varicella-zoster vaccine IM  Screening for lung cancer -     Ambulatory Referral for Lung Cancer Scre  Depression, recurrent (HCC) -     Sertraline HCl; Take 1 tablet (50 mg  total) by mouth daily.  Dispense: 30 tablet; Refill: 3  Vitamin B12 deficiency -     Vitamin B-12; Take 1 tablet (1,000 mcg total) by mouth daily.  Dispense: 90 tablet; Refill: 0    Follow-up: Return in about 6 weeks (around 07/13/2023).   Gilmore Laroche, FNP

## 2023-06-04 ENCOUNTER — Telehealth: Payer: Self-pay | Admitting: Family Medicine

## 2023-06-04 ENCOUNTER — Other Ambulatory Visit: Payer: Self-pay

## 2023-06-04 DIAGNOSIS — E559 Vitamin D deficiency, unspecified: Secondary | ICD-10-CM

## 2023-06-04 MED ORDER — ALBUTEROL SULFATE HFA 108 (90 BASE) MCG/ACT IN AERS: INHALATION_SPRAY | RESPIRATORY_TRACT | 0 refills | Status: DC

## 2023-06-04 MED ORDER — VITAMIN D (ERGOCALCIFEROL) 1.25 MG (50000 UNIT) PO CAPS
50000.0000 [IU] | ORAL_CAPSULE | ORAL | 1 refills | Status: DC
Start: 2023-06-04 — End: 2023-10-22

## 2023-06-04 NOTE — Telephone Encounter (Signed)
Prescription Request  06/04/2023  LOV: 06/01/2023  What is the name of the medication or equipment? Vitamin D, Ergocalciferol, (DRISDOL) 1.25 MG (50000 UNIT) CAPS capsule   albuterol (VENTOLIN HFA) 108 (90 Base) MCG/ACT inhaler   Have you contacted your pharmacy to request a refill? No    [facility] - [place], [place] - [address] [place] [address] Phone: [phone] Fax: [phone]    Patient notified that their request is being sent to the clinical staff for review and that they should receive a response within 2 business days.   Please advise at [facility] [phone]  [facility]. Pt [employer] account is CLOSED.

## 2023-06-04 NOTE — Telephone Encounter (Signed)
Rx sent 

## 2023-06-09 ENCOUNTER — Telehealth: Payer: Self-pay | Admitting: Family Medicine

## 2023-06-09 ENCOUNTER — Other Ambulatory Visit: Payer: Self-pay

## 2023-06-09 ENCOUNTER — Ambulatory Visit: Payer: Medicaid Other | Admitting: Orthopedic Surgery

## 2023-06-09 DIAGNOSIS — K2100 Gastro-esophageal reflux disease with esophagitis, without bleeding: Secondary | ICD-10-CM

## 2023-06-09 MED ORDER — OMEPRAZOLE 40 MG PO CPDR
40.0000 mg | DELAYED_RELEASE_CAPSULE | Freq: Two times a day (BID) | ORAL | 0 refills | Status: DC
Start: 2023-06-09 — End: 2023-09-16

## 2023-06-09 NOTE — Telephone Encounter (Signed)
Patient needs new script for 2x daily   omeprazole (PRILOSEC) 40 MG capsule   [facility] - [place], [place] - [address], [place] [address] Phone: [phone]  Fax: [phone]

## 2023-06-15 ENCOUNTER — Other Ambulatory Visit: Payer: Self-pay | Admitting: Family Medicine

## 2023-06-15 DIAGNOSIS — E785 Hyperlipidemia, unspecified: Secondary | ICD-10-CM

## 2023-06-19 ENCOUNTER — Other Ambulatory Visit: Payer: Self-pay | Admitting: Family Medicine

## 2023-06-19 DIAGNOSIS — G479 Sleep disorder, unspecified: Secondary | ICD-10-CM

## 2023-06-23 ENCOUNTER — Ambulatory Visit: Payer: Medicaid Other | Admitting: Orthopedic Surgery

## 2023-07-01 ENCOUNTER — Other Ambulatory Visit: Payer: Self-pay | Admitting: Family Medicine

## 2023-07-01 ENCOUNTER — Telehealth: Payer: Self-pay | Admitting: Family Medicine

## 2023-07-01 DIAGNOSIS — G479 Sleep disorder, unspecified: Secondary | ICD-10-CM

## 2023-07-01 NOTE — Telephone Encounter (Signed)
Spoke to pt advised she can come pick up a copy of AVS whenever she is able to, hand exercises were attached to AVS from last office visit. Will try to come by to pick this up.

## 2023-07-01 NOTE — Telephone Encounter (Signed)
Patient called she has lost her hand exercises asked for nurse to give her a call when ready to pick up.

## 2023-07-03 ENCOUNTER — Telehealth: Payer: Self-pay | Admitting: Family Medicine

## 2023-07-03 ENCOUNTER — Other Ambulatory Visit: Payer: Self-pay

## 2023-07-03 DIAGNOSIS — G479 Sleep disorder, unspecified: Secondary | ICD-10-CM

## 2023-07-03 MED ORDER — HYDROXYZINE PAMOATE 25 MG PO CAPS
ORAL_CAPSULE | ORAL | 0 refills | Status: DC
Start: 2023-07-03 — End: 2023-07-13

## 2023-07-03 NOTE — Telephone Encounter (Signed)
Prescription Request  07/03/2023  LOV: 06/01/2023  What is the name of the medication or equipment? hydrOXYzine (VISTARIL) 25 MG capsule   Have you contacted your pharmacy to request a refill? Yes   Which pharmacy would you like this sent to?    [facility] [address] [place] - [address] [place] [address] Phone: [phone] Fax: [phone]    Patient notified that their request is being sent to the clinical staff for review and that they should receive a response within 2 business days.   Please advise at [facility] [phone]

## 2023-07-03 NOTE — Telephone Encounter (Signed)
Refill sent.

## 2023-07-07 ENCOUNTER — Other Ambulatory Visit: Payer: Self-pay

## 2023-07-07 MED ORDER — ALBUTEROL SULFATE HFA 108 (90 BASE) MCG/ACT IN AERS: INHALATION_SPRAY | RESPIRATORY_TRACT | 0 refills | Status: DC

## 2023-07-13 ENCOUNTER — Ambulatory Visit (INDEPENDENT_AMBULATORY_CARE_PROVIDER_SITE_OTHER): Payer: Medicaid Other | Admitting: Family Medicine

## 2023-07-13 ENCOUNTER — Encounter: Payer: Self-pay | Admitting: Family Medicine

## 2023-07-13 VITALS — BP 119/84 | HR 91 | Ht 64.0 in | Wt 162.1 lb

## 2023-07-13 DIAGNOSIS — R29898 Other symptoms and signs involving the musculoskeletal system: Secondary | ICD-10-CM

## 2023-07-13 DIAGNOSIS — E7849 Other hyperlipidemia: Secondary | ICD-10-CM | POA: Diagnosis not present

## 2023-07-13 DIAGNOSIS — E559 Vitamin D deficiency, unspecified: Secondary | ICD-10-CM

## 2023-07-13 DIAGNOSIS — G479 Sleep disorder, unspecified: Secondary | ICD-10-CM

## 2023-07-13 DIAGNOSIS — R7301 Impaired fasting glucose: Secondary | ICD-10-CM

## 2023-07-13 DIAGNOSIS — E038 Other specified hypothyroidism: Secondary | ICD-10-CM

## 2023-07-13 DIAGNOSIS — F411 Generalized anxiety disorder: Secondary | ICD-10-CM

## 2023-07-13 MED ORDER — HYDROXYZINE PAMOATE 25 MG PO CAPS
ORAL_CAPSULE | ORAL | 2 refills | Status: DC
Start: 2023-07-13 — End: 2023-08-17

## 2023-07-13 NOTE — Patient Instructions (Addendum)
I appreciate the opportunity to provide care to you today!    Follow up:  3 months  Labs: please stop by the lab during the week to get your blood drawn (CBC, CMP, TSH, Lipid profile, HgA1c, Vit D)  Nonpharmacologic management of anxiety and depression  Mindfulness and Meditation Practices like mindfulness meditation can help reduce symptoms by promoting relaxation and present-moment awareness.  Exercise  Regular physical activity has been shown to improve mood and reduce anxiety through the release of endorphins and other neurochemicals.  Healthy Diet Eating a balanced diet rich in fruits, vegetables, whole grains, and lean proteins can support overall mental health.  Sleep Hygiene  Establishing a regular sleep routine and ensuring good sleep quality can significantly impact mood and anxiety levels.  Stress Management Techniques Activities such as yoga, tai chi, and deep breathing exercises can help manage stress.  Social Support Maintaining strong relationships and seeking support from friends, family, or support groups can provide emotional comfort and reduce feelings of isolation.  Lifestyle Modifications Reducing alcohol and caffeine intake, quitting smoking, and avoiding recreational drugs can improve symptoms.  Art and Music Therapy Engaging in creative activities like painting, drawing, or playing music can be therapeutic and help express emotions.  Light Therapy Particularly useful for seasonal affective disorder (SAD), exposure to bright light can help regulate mood. .      Please continue to a heart-healthy diet and increase your physical activities. Try to exercise for at least five days a week.    It was a pleasure to see you and I look forward to continuing to work together on your health and well-being. Please do not hesitate to call the office if you need care or have questions about your care.  In case of emergency, please visit the Emergency Department for  urgent care, or contact our clinic at [phone] to schedule an appointment. We're here to help you!   Have a wonderful day and week. With Gratitude, Gilmore Laroche MSN, FNP-BC

## 2023-07-13 NOTE — Progress Notes (Signed)
Established Patient Office Visit  Subjective:  Patient ID: Denise Ford, female    DOB: 12/19/1961  Age: 62 y.o. MRN: [id number]  CC:  Chief Complaint  Patient presents with   Chronic Care Management    6 week f/u, states hand exercises have been helping did not go to referral appt.    HPI Denise Ford is a 62 y.o. female presents for anxiety follow up. For the details of today's visit, please refer to the assessment and plan.    Past Medical History:  Diagnosis Date   Anxiety    Hyperlipidemia    Vocal cord polyps     Past Surgical History:  Procedure Laterality Date   CESAREAN SECTION     x2   COLONOSCOPY WITH PROPOFOL N/A 07/18/2022   Procedure: COLONOSCOPY WITH PROPOFOL;  Surgeon: Corbin Ade, MD;  [facility];  Service: Endoscopy;  Laterality: N/A;  12:45pm   ESOPHAGOGASTRODUODENOSCOPY (EGD) WITH PROPOFOL N/A 03/24/2022   Procedure: ESOPHAGOGASTRODUODENOSCOPY (EGD) WITH PROPOFOL;  Surgeon: Corbin Ade, MD;  [facility];  Service: Endoscopy;  Laterality: N/A;  1:30pm   MALONEY DILATION N/A 03/24/2022   Procedure: Elease Hashimoto DILATION;  Surgeon: Corbin Ade, MD;  [facility];  Service: Endoscopy;  Laterality: N/A;   POLYPECTOMY  07/18/2022   Procedure: POLYPECTOMY;  Surgeon: Corbin Ade, MD;  [facility];  Service: Endoscopy;;   TUBAL LIGATION      Family History  Problem Relation Age of Onset   Hypertension Mother    COPD Mother    Cancer Mother        thoat cancer   Hypertension Father    Cancer Paternal Aunt        pancreatic cancer   Cancer Paternal Uncle    Colon cancer Neg Hx     Social History   Socioeconomic History   Marital status: Married    Spouse name: Not on file   Number of children: Not on file   Years of education: Not on file   Highest education level: Not on file  Occupational History   Not on file  Tobacco Use   Smoking status: Every Day    Current packs/day: 0.50     Average packs/day: 0.5 packs/day for 30.0 years (15.0 ttl pk-yrs)    Types: Cigarettes   Smokeless tobacco: Never  Vaping Use   Vaping status: Never Used  Substance and Sexual Activity   Alcohol use: Not Currently    Comment: prior heavy etoh use/ seldom   Drug use: Yes    Frequency: 2.0 times per week    Types: Marijuana    Comment: occ; last used 03/23/22   Sexual activity: Not Currently  Other Topics Concern   Not on file  Social History Narrative   Not on file   Social Determinants of Health   Financial Resource Strain: Not on file  Food Insecurity: Not on file  Transportation Needs: Not on file  Physical Activity: Not on file  Stress: Not on file  Social Connections: Not on file  Intimate Partner Violence: Not on file    Outpatient Medications Prior to Visit  Medication Sig Dispense Refill   albuterol (VENTOLIN HFA) 108 (90 Base) MCG/ACT inhaler INHALE 2 PUFFS BY MOUTH EVERY 6 HOURS AS NEEDED FOR COUGHING, WHEEZING, OR SHORTNESS OF BREATH 20.1 g 0   atorvastatin (LIPITOR) 20 MG tablet Take 1 tablet by mouth once daily 90 tablet 0  cyanocobalamin (VITAMIN B12) 1000 MCG tablet Take 1 tablet (1,000 mcg total) by mouth daily. 90 tablet 0   omeprazole (PRILOSEC) 40 MG capsule Take 1 capsule (40 mg total) by mouth in the morning and at bedtime. 180 capsule 0   sertraline (ZOLOFT) 50 MG tablet Take 1 tablet (50 mg total) by mouth daily. 30 tablet 3   Vitamin D, Ergocalciferol, (DRISDOL) 1.25 MG (50000 UNIT) CAPS capsule Take 1 capsule (50,000 Units total) by mouth every 7 (seven) days. 10 capsule 1   hydrOXYzine (VISTARIL) 25 MG capsule TAKE 1 CAPSULE BY MOUTH EVERY 8 HOURS AS NEEDED 30 capsule 0   No facility-administered medications prior to visit.    No Known Allergies  ROS Review of Systems  Constitutional:  Negative for chills and fever.  Eyes:  Negative for visual disturbance.  Respiratory:  Negative for chest tightness and shortness of breath.   Neurological:   Negative for dizziness and headaches.      Objective:    Physical Exam HENT:     Head: Normocephalic.     Mouth/Throat:     Mouth: Mucous membranes are moist.  Cardiovascular:     Rate and Rhythm: Normal rate.     Heart sounds: Normal heart sounds.  Pulmonary:     Effort: Pulmonary effort is normal.     Breath sounds: Normal breath sounds.  Neurological:     Mental Status: She is alert.     BP 119/84   Pulse 91   Ht 5\' 4"  (1.626 m)   Wt 162 lb 1.9 oz (73.5 kg)   SpO2 94%   BMI 27.83 kg/m  Wt Readings from Last 3 Encounters:  07/13/23 162 lb 1.9 oz (73.5 kg)  06/01/23 157 lb 1.3 oz (71.3 kg)  04/20/23 166 lb 1.9 oz (75.4 kg)    Lab Results  Component Value Date   TSH 2.510 03/16/2023   Lab Results  Component Value Date   WBC 8.2 03/16/2023   HGB 14.8 03/16/2023   HCT 42.7 03/16/2023   MCV 95 03/16/2023   PLT 307 03/16/2023   Lab Results  Component Value Date   NA 142 03/16/2023   K 4.1 03/16/2023   CO2 25 03/16/2023   GLUCOSE 98 03/16/2023   BUN 11 03/16/2023   CREATININE 0.81 03/16/2023   BILITOT 0.4 03/16/2023   ALKPHOS 78 03/16/2023   AST 19 03/16/2023   ALT 15 03/16/2023   PROT 6.5 03/16/2023   ALBUMIN 4.4 03/16/2023   CALCIUM 10.3 03/16/2023   ANIONGAP 6 11/28/2022   EGFR 83 03/16/2023   Lab Results  Component Value Date   CHOL 171 03/16/2023   Lab Results  Component Value Date   HDL 59 03/16/2023   Lab Results  Component Value Date   LDLCALC 86 03/16/2023   Lab Results  Component Value Date   TRIG 152 (H) 03/16/2023   Lab Results  Component Value Date   CHOLHDL 2.9 03/16/2023   Lab Results  Component Value Date   HGBA1C 5.4 03/16/2023      Assessment & Plan:  GAD (generalized anxiety disorder) Assessment & Plan: GAD-7 is 2 PHQ-9 is 3 Stable on Zoloft 50 mg daily and hydroxyzine 25 mg q8h Denies SI/HI Reviewed Nonpharmacologic management to decrease anxiety    Decreased grip strength of right hand Assessment &  Plan: Hand grip strength has improved bilaterally  5/5 noted bilateral Finger to finger test within desire limits   Sleep disturbance -  hydrOXYzine Pamoate; TAKE 1 CAPSULE BY MOUTH EVERY 8 HOURS AS NEEDED  Dispense: 30 capsule; Refill: 2  Vitamin D deficiency -     VITAMIN D 25 Hydroxy (Vit-D Deficiency, Fractures)  Other specified hypothyroidism -     TSH + free T4  Other hyperlipidemia -     Lipid panel -     CMP14+EGFR -     CBC with Differential/Platelet  IFG (impaired fasting glucose) -     Hemoglobin A1c  Note: This chart has been completed using Engineer, civil (consulting) software, and while attempts have been made to ensure accuracy, certain words and phrases may not be transcribed as intended.    Follow-up: Return in about 3 months (around 10/13/2023).   Gilmore Laroche, FNP

## 2023-07-13 NOTE — Assessment & Plan Note (Signed)
Hand grip strength has improved bilaterally  5/5 noted bilateral Finger to finger test within desire limits

## 2023-07-13 NOTE — Assessment & Plan Note (Signed)
GAD-7 is 2 PHQ-9 is 3 Stable on Zoloft 50 mg daily and hydroxyzine 25 mg q8h Denies SI/HI Reviewed Nonpharmacologic management to decrease anxiety

## 2023-07-15 DIAGNOSIS — E7849 Other hyperlipidemia: Secondary | ICD-10-CM | POA: Diagnosis not present

## 2023-07-15 DIAGNOSIS — R7301 Impaired fasting glucose: Secondary | ICD-10-CM | POA: Diagnosis not present

## 2023-07-15 DIAGNOSIS — E038 Other specified hypothyroidism: Secondary | ICD-10-CM | POA: Diagnosis not present

## 2023-07-15 DIAGNOSIS — E559 Vitamin D deficiency, unspecified: Secondary | ICD-10-CM | POA: Diagnosis not present

## 2023-07-16 LAB — CMP14+EGFR
ALT: 28 IU/L (ref 0–32)
AST: 25 IU/L (ref 0–40)
Albumin: 4.4 g/dL (ref 3.8–4.9)
Alkaline Phosphatase: 86 IU/L (ref 44–121)
BUN/Creatinine Ratio: 16 (ref 12–28)
BUN: 13 mg/dL (ref 8–27)
Bilirubin Total: <0.2 mg/dL (ref 0.0–1.2)
CO2: 26 mmol/L (ref 20–29)
Calcium: 10 mg/dL (ref 8.7–10.3)
Chloride: 103 mmol/L (ref 96–106)
Creatinine, Ser: 0.8 mg/dL (ref 0.57–1.00)
Globulin, Total: 2.2 g/dL (ref 1.5–4.5)
Glucose: 92 mg/dL (ref 70–99)
Potassium: 4.6 mmol/L (ref 3.5–5.2)
Sodium: 142 mmol/L (ref 134–144)
Total Protein: 6.6 g/dL (ref 6.0–8.5)
eGFR: 84 mL/min/{1.73_m2} (ref >59)

## 2023-07-16 LAB — CBC WITH DIFFERENTIAL/PLATELET
Basophils Absolute: 0.1 10*3/uL (ref 0.0–0.2)
Basos: 1 %
EOS (ABSOLUTE): 0.1 10*3/uL (ref 0.0–0.4)
Eos: 1 %
Hematocrit: 45.4 % (ref 34.0–46.6)
Hemoglobin: 15.3 g/dL (ref 11.1–15.9)
Immature Grans (Abs): 0 10*3/uL (ref 0.0–0.1)
Immature Granulocytes: 0 %
Lymphocytes Absolute: 5.7 10*3/uL — ABNORMAL HIGH (ref 0.7–3.1)
Lymphs: 58 %
MCH: 33 pg (ref 26.6–33.0)
MCHC: 33.7 g/dL (ref 31.5–35.7)
MCV: 98 fL — ABNORMAL HIGH (ref 79–97)
Monocytes Absolute: 0.7 10*3/uL (ref 0.1–0.9)
Monocytes: 7 %
Neutrophils Absolute: 3.3 10*3/uL (ref 1.4–7.0)
Neutrophils: 33 %
Platelets: 259 10*3/uL (ref 150–450)
RBC: 4.64 x10E6/uL (ref 3.77–5.28)
RDW: 13.4 % (ref 11.7–15.4)
WBC: 9.9 10*3/uL (ref 3.4–10.8)

## 2023-07-16 LAB — LIPID PANEL
Chol/HDL Ratio: 2.8 ratio (ref 0.0–4.4)
Cholesterol, Total: 216 mg/dL — ABNORMAL HIGH (ref 100–199)
HDL: 76 mg/dL (ref >39)
LDL Chol Calc (NIH): 106 mg/dL — ABNORMAL HIGH (ref 0–99)
Triglycerides: 202 mg/dL — ABNORMAL HIGH (ref 0–149)
VLDL Cholesterol Cal: 34 mg/dL (ref 5–40)

## 2023-07-16 LAB — HEMOGLOBIN A1C
Est. average glucose Bld gHb Est-mCnc: 117 mg/dL
Hgb A1c MFr Bld: 5.7 % — ABNORMAL HIGH (ref 4.8–5.6)

## 2023-07-16 LAB — TSH+FREE T4
Free T4: 0.94 ng/dL (ref 0.82–1.77)
TSH: 5.89 u[IU]/mL — ABNORMAL HIGH (ref 0.450–4.500)

## 2023-07-16 LAB — VITAMIN D 25 HYDROXY (VIT D DEFICIENCY, FRACTURES): Vit D, 25-Hydroxy: 48.5 ng/mL (ref 30.0–100.0)

## 2023-07-17 ENCOUNTER — Other Ambulatory Visit: Payer: Self-pay | Admitting: Family Medicine

## 2023-07-17 DIAGNOSIS — E038 Other specified hypothyroidism: Secondary | ICD-10-CM

## 2023-07-17 NOTE — Progress Notes (Signed)
Please encourage the patient to return to assess her thyroid levels in 6 weeks around August 27, 2023.  Her labs indicate subclinical hypothyroidism.  Her hemoglobin A1c is 5.7, this indicate that she is prediabetic. I recommend avoiding simple carbohydrates, including cakes, sweet desserts, ice cream, soda (diet or regular), sweet tea, candies, chips, cookies, store-bought juices, alcohol in excess of 1-2 drinks a day, lemonade, artificial sweeteners, donuts, coffee creamers, and sugar-free products.  I recommend avoiding greasy, fatty foods with increased physical activity.

## 2023-07-20 ENCOUNTER — Other Ambulatory Visit: Payer: Self-pay | Admitting: *Deleted

## 2023-07-20 DIAGNOSIS — F17210 Nicotine dependence, cigarettes, uncomplicated: Secondary | ICD-10-CM

## 2023-07-20 DIAGNOSIS — Z87891 Personal history of nicotine dependence: Secondary | ICD-10-CM

## 2023-07-20 DIAGNOSIS — Z122 Encounter for screening for malignant neoplasm of respiratory organs: Secondary | ICD-10-CM

## 2023-07-27 ENCOUNTER — Ambulatory Visit (HOSPITAL_COMMUNITY): Payer: Medicaid Other

## 2023-08-03 ENCOUNTER — Other Ambulatory Visit: Payer: Self-pay | Admitting: Family Medicine

## 2023-08-03 DIAGNOSIS — E038 Other specified hypothyroidism: Secondary | ICD-10-CM | POA: Diagnosis not present

## 2023-08-04 NOTE — Progress Notes (Signed)
Kindly infom the patient that her labs are stable

## 2023-08-05 ENCOUNTER — Telehealth: Payer: Self-pay | Admitting: Family Medicine

## 2023-08-05 ENCOUNTER — Ambulatory Visit (HOSPITAL_COMMUNITY)
Admission: RE | Admit: 2023-08-05 | Discharge: 2023-08-05 | Disposition: A | Discharge status: Home / Self Care | Payer: Medicaid Other | Source: Ambulatory Visit | Attending: Family Medicine | Admitting: Family Medicine

## 2023-08-05 DIAGNOSIS — Z1231 Encounter for screening mammogram for malignant neoplasm of breast: Secondary | ICD-10-CM | POA: Diagnosis not present

## 2023-08-05 NOTE — Telephone Encounter (Signed)
Spoke with patient.

## 2023-08-05 NOTE — Telephone Encounter (Signed)
Patient returning lab result call 

## 2023-08-07 ENCOUNTER — Other Ambulatory Visit (HOSPITAL_COMMUNITY): Payer: Self-pay | Admitting: Family Medicine

## 2023-08-07 DIAGNOSIS — R928 Other abnormal and inconclusive findings on diagnostic imaging of breast: Secondary | ICD-10-CM

## 2023-08-17 ENCOUNTER — Other Ambulatory Visit: Payer: Self-pay | Admitting: Family Medicine

## 2023-08-17 DIAGNOSIS — G479 Sleep disorder, unspecified: Secondary | ICD-10-CM

## 2023-08-24 ENCOUNTER — Encounter: Payer: Self-pay | Admitting: Primary Care

## 2023-08-24 ENCOUNTER — Ambulatory Visit (INDEPENDENT_AMBULATORY_CARE_PROVIDER_SITE_OTHER): Payer: Medicaid Other | Admitting: Primary Care

## 2023-08-24 DIAGNOSIS — F17200 Nicotine dependence, unspecified, uncomplicated: Secondary | ICD-10-CM | POA: Diagnosis not present

## 2023-08-24 NOTE — Patient Instructions (Signed)

## 2023-08-24 NOTE — Progress Notes (Signed)
Virtual Visit via Telephone Note  I connected with Denise Ford on 08/24/23 at  9:00 AM EDT by telephone and verified that I am speaking with the correct person using two identifiers.  Location: Patient: Home Provider: Office   I discussed the limitations, risks, security and privacy concerns of performing an evaluation and management service by telephone and the availability of in person appointments. I also discussed with the patient that there may be a patient responsible charge related to this service. The patient expressed understanding and agreed to proceed.   Shared Decision Making Visit Lung Cancer Screening Program [phone])   Eligibility: Age 62 y.o. Pack Years Smoking History Calculation 50 (# packs/per year x # years smoked) Recent History of coughing up blood  no Unexplained weight loss? no ( >Than 15 pounds within the last 6 months ) Prior History Lung / other cancer no (Diagnosis within the last 5 years already requiring surveillance chest CT Scans). Smoking Status Current Smoker Former Smokers: Years since quit: < 1 year  Quit Date: Na  Visit Components: Discussion included one or more decision making aids. yes Discussion included risk/benefits of screening. yes Discussion included potential follow up diagnostic testing for abnormal scans. yes Discussion included meaning and risk of over diagnosis. yes Discussion included meaning and risk of False Positives. yes Discussion included meaning of total radiation exposure. yes  Counseling Included: Importance of adherence to annual lung cancer LDCT screening. yes Impact of comorbidities on ability to participate in the program. yes Ability and willingness to under diagnostic treatment. yes  Smoking Cessation Counseling: Current Smokers:  Discussed importance of smoking cessation. yes Information about tobacco cessation classes and interventions provided to patient. yes Patient provided with "ticket" for LDCT  Scan. Na Symptomatic Patient. no  Counseling(Intermediate counseling: > three minutes) 99406 Diagnosis Code: Tobacco Use Z72.0 Asymptomatic Patient yes  Counseling (Intermediate counseling: > three minutes counseling) V2536 Former Smokers:  Discussed the importance of maintaining cigarette abstinence. yes Diagnosis Code: Personal History of Nicotine Dependence. U44.034 Information about tobacco cessation classes and interventions provided to patient. Yes Patient provided with "ticket" for LDCT Scan. NA Written Order for Lung Cancer Screening with LDCT placed in Epic. Yes (CT Chest Lung Cancer Screening Low Dose W/O CM) [id number] Z12.2-Screening of respiratory organs Z87.891-Personal history of nicotine dependence  I have spent 25 minutes of face to face/ virtual visit   time with [name] discussing the risks and benefits of lung cancer screening. We viewed / discussed a power point together that explained in detail the above noted topics. We paused at intervals to allow for questions to be asked and answered to ensure understanding.We discussed that the single most powerful action that she can take to decrease her risk of developing lung cancer is to quit smoking. We discussed whether or not she is ready to commit to setting a quit date. We discussed options for tools to aid in quitting smoking including nicotine replacement therapy, non-nicotine medications, support groups, Quit Smart classes, and behavior modification. We discussed that often times setting smaller, more achievable goals, such as eliminating 1 cigarette a day for a week and then 2 cigarettes a day for a week can be helpful in slowly decreasing the number of cigarettes smoked. This allows for a sense of accomplishment as well as providing a clinical benefit. I provided her  with smoking cessation  information  with contact information for community resources, classes, free nicotine replacement therapy, and access to mobile apps, text  messaging,  and on-line smoking cessation help. I have also provided her  the office contact information in the event  needs to contact me, or the screening staff. We discussed the time and location of the scan, and that either Abigail Miyamoto RN, Karlton Lemon, RN  or I will call / send a letter with the results within 24-72 hours of receiving them. The patient verbalized understanding of all of  the above and had no further questions upon leaving the office. They have my contact information in the event they have any further questions.  I spent 3-5 minutes counseling on smoking cessation and the health risks of continued tobacco abuse.  I explained to the patient that there has been a high incidence of coronary artery disease noted on these exams. I explained that this is a non-gated exam therefore degree or severity cannot be determined. This patient is on statin therapy. I have asked the patient to follow-up with their PCP regarding any incidental finding of coronary artery disease and management with diet or medication as their PCP  feels is clinically indicated. The patient verbalized understanding of the above and had no further questions upon completion of the visit.    Glenford Bayley, NP

## 2023-08-25 ENCOUNTER — Ambulatory Visit (HOSPITAL_COMMUNITY)
Admission: RE | Admit: 2023-08-25 | Discharge: 2023-08-25 | Disposition: A | Discharge status: Home / Self Care | Payer: Medicaid Other | Source: Ambulatory Visit | Attending: Acute Care | Admitting: Acute Care

## 2023-08-25 DIAGNOSIS — Z87891 Personal history of nicotine dependence: Secondary | ICD-10-CM | POA: Insufficient documentation

## 2023-08-25 DIAGNOSIS — F17210 Nicotine dependence, cigarettes, uncomplicated: Secondary | ICD-10-CM | POA: Diagnosis not present

## 2023-08-25 DIAGNOSIS — Z122 Encounter for screening for malignant neoplasm of respiratory organs: Secondary | ICD-10-CM | POA: Insufficient documentation

## 2023-08-26 ENCOUNTER — Other Ambulatory Visit: Payer: Self-pay | Admitting: Family Medicine

## 2023-08-28 ENCOUNTER — Other Ambulatory Visit: Payer: Self-pay | Admitting: Family Medicine

## 2023-08-28 DIAGNOSIS — G479 Sleep disorder, unspecified: Secondary | ICD-10-CM

## 2023-09-01 ENCOUNTER — Ambulatory Visit (HOSPITAL_COMMUNITY): Admission: RE | Admit: 2023-09-01 | Payer: Medicaid Other | Source: Ambulatory Visit

## 2023-09-01 ENCOUNTER — Inpatient Hospital Stay (HOSPITAL_COMMUNITY): Admission: RE | Admit: 2023-09-01 | Payer: Medicaid Other | Source: Ambulatory Visit

## 2023-09-02 ENCOUNTER — Other Ambulatory Visit: Payer: Self-pay

## 2023-09-02 DIAGNOSIS — F17210 Nicotine dependence, cigarettes, uncomplicated: Secondary | ICD-10-CM

## 2023-09-02 DIAGNOSIS — Z87891 Personal history of nicotine dependence: Secondary | ICD-10-CM

## 2023-09-02 DIAGNOSIS — Z122 Encounter for screening for malignant neoplasm of respiratory organs: Secondary | ICD-10-CM

## 2023-09-08 ENCOUNTER — Encounter (HOSPITAL_COMMUNITY): Payer: Self-pay

## 2023-09-08 ENCOUNTER — Ambulatory Visit (HOSPITAL_COMMUNITY)
Admission: RE | Admit: 2023-09-08 | Discharge: 2023-09-08 | Disposition: A | Discharge status: Home / Self Care | Payer: Medicaid Other | Source: Ambulatory Visit | Attending: Family Medicine | Admitting: Family Medicine

## 2023-09-08 ENCOUNTER — Other Ambulatory Visit: Payer: Self-pay | Admitting: Family Medicine

## 2023-09-08 DIAGNOSIS — N6321 Unspecified lump in the left breast, upper outer quadrant: Secondary | ICD-10-CM | POA: Diagnosis not present

## 2023-09-08 DIAGNOSIS — R92322 Mammographic fibroglandular density, left breast: Secondary | ICD-10-CM | POA: Diagnosis not present

## 2023-09-08 DIAGNOSIS — R928 Other abnormal and inconclusive findings on diagnostic imaging of breast: Secondary | ICD-10-CM

## 2023-09-08 DIAGNOSIS — N6002 Solitary cyst of left breast: Secondary | ICD-10-CM | POA: Diagnosis not present

## 2023-09-08 DIAGNOSIS — E785 Hyperlipidemia, unspecified: Secondary | ICD-10-CM

## 2023-09-14 ENCOUNTER — Other Ambulatory Visit: Payer: Self-pay | Admitting: Family Medicine

## 2023-09-14 DIAGNOSIS — E785 Hyperlipidemia, unspecified: Secondary | ICD-10-CM

## 2023-09-14 DIAGNOSIS — G479 Sleep disorder, unspecified: Secondary | ICD-10-CM

## 2023-09-15 ENCOUNTER — Telehealth: Payer: Self-pay | Admitting: Family Medicine

## 2023-09-15 NOTE — Telephone Encounter (Signed)
Prescription Request  09/15/2023  LOV: 07/13/2023  What is the name of the medication or equipment? atorvastatin (LIPITOR) 20 MG tablet [[id number]] (per patient why holding this rx)  omeprazole (PRILOSEC) 40 MG capsule [[id number]    Have you contacted your pharmacy to request a refill? Yes   Which pharmacy would you like this sent to?  [facility] [address] [place] - [address] [place] [address] Phone: [phone] Fax: [phone]    Patient notified that their request is being sent to the clinical staff for review and that they should receive a response within 2 business days.   Please advise at Mobile [phone] (mobile)

## 2023-09-16 ENCOUNTER — Other Ambulatory Visit: Payer: Self-pay

## 2023-09-16 DIAGNOSIS — K2100 Gastro-esophageal reflux disease with esophagitis, without bleeding: Secondary | ICD-10-CM

## 2023-09-16 DIAGNOSIS — E785 Hyperlipidemia, unspecified: Secondary | ICD-10-CM

## 2023-09-16 MED ORDER — OMEPRAZOLE 40 MG PO CPDR: 40.0000 mg | DELAYED_RELEASE_CAPSULE | Freq: Two times a day (BID) | ORAL | 0 refills | Status: DC

## 2023-09-16 MED ORDER — ATORVASTATIN CALCIUM 20 MG PO TABS
ORAL_TABLET | ORAL | 0 refills | Status: DC
Start: 2023-09-16 — End: 2024-02-22

## 2023-09-16 NOTE — Telephone Encounter (Signed)
Pt has been informed.

## 2023-09-18 NOTE — Telephone Encounter (Signed)
Patient calling wanting to know if Malachi Bonds had received the results of the lung cancer screening she had done. Please advise  Thank you

## 2023-09-22 ENCOUNTER — Telehealth: Payer: Self-pay | Admitting: Family Medicine

## 2023-09-22 NOTE — Telephone Encounter (Signed)
Patient called about x-ray results.

## 2023-09-22 NOTE — Telephone Encounter (Signed)
Gave her the phone number to [facility] radiology for her to ask on results since they are not showing on our end yet.

## 2023-09-23 ENCOUNTER — Other Ambulatory Visit: Payer: Self-pay | Admitting: Family Medicine

## 2023-09-23 DIAGNOSIS — J439 Emphysema, unspecified: Secondary | ICD-10-CM

## 2023-09-23 NOTE — Progress Notes (Signed)
I called and reviewed the patient's CT scan results of the lungs with her. She is requesting a referral to pulmonary for the pulmonary emphysema noted on the scan. A referral has been placed accordingly

## 2023-09-26 ENCOUNTER — Other Ambulatory Visit: Payer: Self-pay | Admitting: Family Medicine

## 2023-09-26 DIAGNOSIS — F411 Generalized anxiety disorder: Secondary | ICD-10-CM

## 2023-09-26 DIAGNOSIS — F339 Major depressive disorder, recurrent, unspecified: Secondary | ICD-10-CM

## 2023-09-26 DIAGNOSIS — G479 Sleep disorder, unspecified: Secondary | ICD-10-CM

## 2023-10-02 ENCOUNTER — Other Ambulatory Visit: Payer: Self-pay | Admitting: Family Medicine

## 2023-10-02 DIAGNOSIS — F411 Generalized anxiety disorder: Secondary | ICD-10-CM

## 2023-10-02 MED ORDER — ALPRAZOLAM 0.25 MG PO TABS
0.2500 mg | ORAL_TABLET | ORAL | 0 refills | Status: DC | PRN
Start: 2023-10-02 — End: 2023-10-22

## 2023-10-02 NOTE — Progress Notes (Signed)
I called and spoke with the patient, who reported feeling very anxious and not sleeping well since the loss of her husband. The patient requested a prescription for Xanax to help manage her anxiety and improve her sleep. I informed her that I will send a one-time prescription for Xanax to assist in managing her symptoms during this challenging time.

## 2023-10-12 ENCOUNTER — Telehealth: Payer: Self-pay | Admitting: "Endocrinology

## 2023-10-12 ENCOUNTER — Other Ambulatory Visit: Payer: Self-pay | Admitting: *Deleted

## 2023-10-12 DIAGNOSIS — E049 Nontoxic goiter, unspecified: Secondary | ICD-10-CM

## 2023-10-12 NOTE — Telephone Encounter (Signed)
Orders have been updated.

## 2023-10-12 NOTE — Telephone Encounter (Signed)
Pt has an appt 10/21. She needs new lab orders.

## 2023-10-13 ENCOUNTER — Ambulatory Visit: Payer: Medicaid Other | Admitting: Family Medicine

## 2023-10-16 DIAGNOSIS — E049 Nontoxic goiter, unspecified: Secondary | ICD-10-CM | POA: Diagnosis not present

## 2023-10-17 LAB — TSH: TSH: 1.87 u[IU]/mL (ref 0.450–4.500)

## 2023-10-17 LAB — T4, FREE: Free T4: 1.23 ng/dL (ref 0.82–1.77)

## 2023-10-17 LAB — T3, FREE: T3, Free: 2.7 pg/mL (ref 2.0–4.4)

## 2023-10-19 ENCOUNTER — Encounter: Payer: Self-pay | Admitting: "Endocrinology

## 2023-10-19 ENCOUNTER — Ambulatory Visit: Payer: Medicaid Other | Admitting: "Endocrinology

## 2023-10-19 VITALS — BP 116/78 | HR 72 | Ht 64.0 in | Wt 160.0 lb

## 2023-10-19 DIAGNOSIS — E049 Nontoxic goiter, unspecified: Secondary | ICD-10-CM | POA: Diagnosis not present

## 2023-10-19 DIAGNOSIS — F17200 Nicotine dependence, unspecified, uncomplicated: Secondary | ICD-10-CM

## 2023-10-19 NOTE — Progress Notes (Signed)
10/19/2023, 1:06 PM  Endocrinology follow-up note   Subjective:    Patient ID: Denise Ford, female    DOB: May 13, 1961, PCP Denise Laroche, FNP   Past Medical History:  Diagnosis Date   Anxiety    Hyperlipidemia    Vocal cord polyps    Past Surgical History:  Procedure Laterality Date   CESAREAN SECTION     x2   COLONOSCOPY WITH PROPOFOL N/A 07/18/2022   Procedure: COLONOSCOPY WITH PROPOFOL;  Surgeon: Corbin Ade, MD;  [facility];  Service: Endoscopy;  Laterality: N/A;  12:45pm   ESOPHAGOGASTRODUODENOSCOPY (EGD) WITH PROPOFOL N/A 03/24/2022   Procedure: ESOPHAGOGASTRODUODENOSCOPY (EGD) WITH PROPOFOL;  Surgeon: Corbin Ade, MD;  Location: [facility];  Service: Endoscopy;  Laterality: N/A;  1:30pm   MALONEY DILATION N/A 03/24/2022   Procedure: Elease Hashimoto DILATION;  Surgeon: Corbin Ade, MD;  Location: [facility];  Service: Endoscopy;  Laterality: N/A;   POLYPECTOMY  07/18/2022   Procedure: POLYPECTOMY;  Surgeon: Corbin Ade, MD;  [facility]: [facility];  Service: Endoscopy;;   TUBAL LIGATION     Social History   Socioeconomic History   Marital status: Married    Spouse name: Not on file   Number of children: Not on file   Years of education: Not on file   Highest education level: Not on file  Occupational History   Not on file  Tobacco Use   Smoking status: Every Day    Current packs/day: 0.50    Average packs/day: 0.5 packs/day for 30.0 years (15.0 ttl pk-yrs)    Types: Cigarettes    Passive exposure: Current   Smokeless tobacco: Never  Vaping Use   Vaping status: Never Used  Substance and Sexual Activity   Alcohol use: Not Currently    Comment: prior heavy etoh use/ seldom   Drug use: Yes    Frequency: 2.0 times per week    Types: Marijuana    Comment: occ; last used 03/23/22   Sexual activity: Not Currently  Other Topics Concern   Not on file  Social History Narrative    Not on file   Social Determinants of Health   Financial Resource Strain: Not on file  Food Insecurity: Not on file  Transportation Needs: Not on file  Physical Activity: Not on file  Stress: Not on file  Social Connections: Not on file   Family History  Problem Relation Age of Onset   Hypertension Mother    COPD Mother    Cancer Mother        thoat cancer   Hypertension Father    Cancer Paternal Aunt        pancreatic cancer   Cancer Paternal Uncle    Colon cancer Neg Hx    Outpatient Encounter Medications as of 10/19/2023  Medication Sig   ALPRAZolam (XANAX) 0.25 MG tablet Take 1 tablet (0.25 mg total) by mouth as needed for anxiety.   atorvastatin (LIPITOR) 20 MG tablet Take 1 tablet by mouth once daily   cyanocobalamin (VITAMIN B12) 1000 MCG tablet Take 1 tablet (1,000 mcg total) by mouth daily.   hydrOXYzine (VISTARIL) 25 MG capsule TAKE 1 CAPSULE BY MOUTH EVERY 8 HOURS  AS NEEDED   Omega-3 Fatty Acids (FISH OIL PO) Take 1 tablet by mouth daily.   omeprazole (PRILOSEC) 40 MG capsule Take 1 capsule (40 mg total) by mouth in the morning and at bedtime.   sertraline (ZOLOFT) 50 MG tablet Take 1 tablet by mouth once daily   VENTOLIN HFA 108 (90 Base) MCG/ACT inhaler INHALE 2 PUFFS BY MOUTH EVERY 6 HOURS AS NEEDED FOR COUGHING, WHEEZING, OR SHORTNESS OF BREATH   Vitamin D, Ergocalciferol, (DRISDOL) 1.25 MG (50000 UNIT) CAPS capsule Take 1 capsule (50,000 Units total) by mouth every 7 (seven) days.   No facility-administered encounter medications on file as of 10/19/2023.   ALLERGIES: No Known Allergies  VACCINATION STATUS: Immunization History  Administered Date(s) Administered   Moderna Sars-Covid-2 Vaccination 05/04/2020, 06/01/2020, 11/11/2021   Tdap 03/09/2023   Zoster Recombinant(Shingrix) 03/09/2023, 06/01/2023    HPI [name] is 62 y.o. female who presents today to review her biopsy results after she was seen in consultation for nodular goiter last visit.    PCP:  Denise Laroche, FNP.   She is known to have nodular goiter since 2016.  She is not on any thyroid hormone supplements or antithyroid medications.  She underwent previsit thyroid ultrasound which showed similar findings.  She underwent fine-needle aspiration biopsy of this right lobe thyroid nodule with benign findings.  Her previsit thyroid function test are still consistent with euthyroid presentation.    She has no new complaints today.  She continues to smoke. she is a chronic heavy smoker, reports some voice change recently, continues to be have voice hoarseness.   She denies dysphagia, shortness of breath.   -She denies weight loss, palpitations, heat/cold intolerance.  She has some unidentified thyroid dysfunction in one of her grandparents.     Review of Systems  Limited as above.  Objective:       10/19/2023   12:55 PM 07/13/2023   10:48 AM 06/01/2023   10:55 AM  Vitals with BMI  Height 5\' 4"  5\' 4"    Weight 160 lbs 162 lbs 2 oz 157 lbs 1 oz  BMI 27.45 27.81   Systolic 116 119 272  Diastolic 78 84 85  Pulse 72 91 81    BP 116/78   Pulse 72   Ht 5\' 4"  (1.626 m)   Wt 160 lb (72.6 kg)   BMI 27.46 kg/m   Wt Readings from Last 3 Encounters:  10/19/23 160 lb (72.6 kg)  07/13/23 162 lb 1.9 oz (73.5 kg)  06/01/23 157 lb 1.3 oz (71.3 kg)    Physical Exam  Constitutional:  Body mass index is 27.46 kg/m.,  not in acute distress, normal state of mind Eyes: PERRLA, EOMI, no exophthalmos ENT: moist mucous membranes, + gross thyromegaly, no gross cervical lymphadenopathy   CMP ( most recent) CMP     Component Value Date/Time   NA 142 07/15/2023 0826   K 4.6 07/15/2023 0826   CL 103 07/15/2023 0826   CO2 26 07/15/2023 0826   GLUCOSE 92 07/15/2023 0826   GLUCOSE 86 11/28/2022 1416   BUN 13 07/15/2023 0826   CREATININE 0.80 07/15/2023 0826   CALCIUM 10.0 07/15/2023 0826   PROT 6.6 07/15/2023 0826   ALBUMIN 4.4 07/15/2023 0826   AST 25 07/15/2023 0826   ALT  28 07/15/2023 0826   ALKPHOS 86 07/15/2023 0826   BILITOT <0.2 07/15/2023 0826   GFRNONAA >60 11/28/2022 1416   GFRAA >60 08/22/2020 0949     Diabetic Labs (  most recent): Lab Results  Component Value Date   HGBA1C 5.7 (H) 07/15/2023   HGBA1C 5.4 03/16/2023   HGBA1C 5.3 08/05/2022     Lipid Panel ( most recent) Lipid Panel     Component Value Date/Time   CHOL 216 (H) 07/15/2023 0826   TRIG 202 (H) 07/15/2023 0826   HDL 76 07/15/2023 0826   CHOLHDL 2.8 07/15/2023 0826   CHOLHDL 2.7 11/28/2022 1416   VLDL 31 11/28/2022 1416   LDLCALC 106 (H) 07/15/2023 0826   LABVLDL 34 07/15/2023 0826      Lab Results  Component Value Date   TSH 1.870 10/16/2023   TSH 2.100 08/03/2023   TSH 5.890 (H) 07/15/2023   TSH 2.510 03/16/2023   TSH 1.908 10/14/2022   TSH 2.239 10/22/2021   TSH 1.836 08/22/2020   FREET4 1.23 10/16/2023   FREET4 1.24 08/03/2023   FREET4 0.94 07/15/2023   FREET4 1.44 03/16/2023   FREET4 0.91 10/14/2022   FREET4 0.69 10/22/2021   FREET4 0.77 08/22/2020     Thyroid ultrasound on August 14, 2020: Right lobe measured 5.4 cm with 2.6 cm nodule which increased in size from 2.2 cm in 2016 Left lobe measured 4.3 cm with no discrete nodules.   Fine-needle aspiration of right lobe nodule on October 10, 2020 FINAL MICROSCOPIC DIAGNOSIS:  - Consistent with benign follicular nodule (Bethesda category II)  SPECIMEN ADEQUACY:  Satisfactory for evaluation    Assessment & Plan:   1. Nodular goiter-benign FNA 2. Current smoker -Her fine-needle aspiration biopsy has been negative for malignancy.  She continues to have euthyroid nodular goiter.    -She will not need surgery or antithyroid intervention at this time.  She will return in 1 year with repeat thyroid function test and thyroid ultrasound.    The patient was counseled on the dangers of tobacco use, and was advised to quit.  Reviewed strategies to maximize success, including removing cigarettes and smoking  materials from environment.   If she continues to have voice hoarseness, she will benefit from evaluation by ENT given her history of heavy smoking.   - she is advised to maintain close follow up with Denise Laroche, FNP for primary care needs.   I spent  22  minutes in the care of the patient today including review of labs from Thyroid Function, CMP, and other relevant labs ; imaging/biopsy records (current and previous including abstractions from other facilities); face-to-face time discussing  her lab results and symptoms, medications doses, her options of short and long term treatment based on the latest standards of care / guidelines;   and documenting the encounter.  Denise Ford  participated in the discussions, expressed understanding, and voiced agreement with the above plans.  All questions were answered to her satisfaction. she is encouraged to contact clinic should she have any questions or concerns prior to her return visit.    Follow up plan: Return in about 1 year (around 10/18/2024) for F/U with Pre-visit Labs, Thyroid / Neck Ultrasound.   Marquis Lunch, MD [facility] [address] [place], [place] [address] Phone: [phone]  Fax: [phone]     10/19/2023, 1:06 PM  This note was partially dictated with voice recognition software. Similar sounding words can be transcribed inadequately or may not  be corrected upon review.

## 2023-10-19 NOTE — Progress Notes (Deleted)
   Denise Ford, female    DOB: 11/11/1961    MRN: [id number]   Brief patient profile:  62  yo*** *** referred to pulmonary clinic in [place]  10/20/2023 by *** for ***      History of Present Illness  10/20/2023  Pulmonary/ 1st office eval/ Sherene Sires / [place] [facility]  No chief complaint on file.    Dyspnea:  *** Cough: *** Sleep: *** SABA use: *** 02: *** Lung cancer screen: ***   Outpatient Medications Prior to Visit  Medication Sig Dispense Refill   ALPRAZolam (XANAX) 0.25 MG tablet Take 1 tablet (0.25 mg total) by mouth as needed for anxiety. 15 tablet 0   atorvastatin (LIPITOR) 20 MG tablet Take 1 tablet by mouth once daily 90 tablet 0   cyanocobalamin (VITAMIN B12) 1000 MCG tablet Take 1 tablet (1,000 mcg total) by mouth daily. 90 tablet 0   hydrOXYzine (VISTARIL) 25 MG capsule TAKE 1 CAPSULE BY MOUTH EVERY 8 HOURS AS NEEDED 30 capsule 0   Omega-3 Fatty Acids (FISH OIL PO) Take 1 tablet by mouth daily.     omeprazole (PRILOSEC) 40 MG capsule Take 1 capsule (40 mg total) by mouth in the morning and at bedtime. 180 capsule 0   sertraline (ZOLOFT) 50 MG tablet Take 1 tablet by mouth once daily 30 tablet 0   VENTOLIN HFA 108 (90 Base) MCG/ACT inhaler INHALE 2 PUFFS BY MOUTH EVERY 6 HOURS AS NEEDED FOR COUGHING, WHEEZING, OR SHORTNESS OF BREATH 18 g 0   Vitamin D, Ergocalciferol, (DRISDOL) 1.25 MG (50000 UNIT) CAPS capsule Take 1 capsule (50,000 Units total) by mouth every 7 (seven) days. 10 capsule 1   No facility-administered medications prior to visit.    Past Medical History:  Diagnosis Date   Anxiety    Hyperlipidemia    Vocal cord polyps       Objective:     There were no vitals taken for this visit.         Assessment   No problem-specific Assessment & Plan notes found for this encounter.     Sandrea Hughs, MD 10/19/2023

## 2023-10-20 ENCOUNTER — Institutional Professional Consult (permissible substitution): Payer: Medicaid Other | Admitting: Internal Medicine

## 2023-10-22 ENCOUNTER — Encounter: Payer: Self-pay | Admitting: Family Medicine

## 2023-10-22 ENCOUNTER — Ambulatory Visit (INDEPENDENT_AMBULATORY_CARE_PROVIDER_SITE_OTHER): Payer: Medicaid Other | Admitting: Family Medicine

## 2023-10-22 VITALS — BP 125/80 | HR 75 | Resp 16 | Ht 64.0 in | Wt 158.1 lb

## 2023-10-22 DIAGNOSIS — E7849 Other hyperlipidemia: Secondary | ICD-10-CM | POA: Diagnosis not present

## 2023-10-22 DIAGNOSIS — R7301 Impaired fasting glucose: Secondary | ICD-10-CM

## 2023-10-22 DIAGNOSIS — F339 Major depressive disorder, recurrent, unspecified: Secondary | ICD-10-CM

## 2023-10-22 DIAGNOSIS — F411 Generalized anxiety disorder: Secondary | ICD-10-CM

## 2023-10-22 DIAGNOSIS — Z23 Encounter for immunization: Secondary | ICD-10-CM

## 2023-10-22 DIAGNOSIS — E038 Other specified hypothyroidism: Secondary | ICD-10-CM

## 2023-10-22 DIAGNOSIS — E559 Vitamin D deficiency, unspecified: Secondary | ICD-10-CM | POA: Diagnosis not present

## 2023-10-22 MED ORDER — ALPRAZOLAM 0.5 MG PO TABS
0.5000 mg | ORAL_TABLET | Freq: Three times a day (TID) | ORAL | 0 refills | Status: DC | PRN
Start: 2023-10-22 — End: 2023-11-04

## 2023-10-22 MED ORDER — SERTRALINE HCL 50 MG PO TABS
50.0000 mg | ORAL_TABLET | Freq: Every day | ORAL | 2 refills | Status: DC
Start: 2023-10-22 — End: 2024-01-25

## 2023-10-22 MED ORDER — VITAMIN D (ERGOCALCIFEROL) 1.25 MG (50000 UNIT) PO CAPS: 50000.0000 [IU] | ORAL_CAPSULE | ORAL | 1 refills | Status: DC

## 2023-10-22 NOTE — Assessment & Plan Note (Signed)
Refill Zoloft 50 mg The patient was encouraged to take Xanax 0.5 mg up to 3 times daily for anxiety management. She was informed that the therapy with Xanax will be discontinued after 3 months, as it is intended for short-term use to help her cope during this difficult time. The patient verbalized understanding.  She was also encouraged to discontinue hydroxyzine, as combining it with Xanax increases the risk of respiratory depression. The patient verbalized understanding.  Coping skills, including journaling, mindfulness, and meditation, were discussed to help her manage her anxiety and cope with the loss of her husband. The patient verbalized understanding.

## 2023-10-22 NOTE — Patient Instructions (Addendum)
I appreciate the opportunity to provide care to you today!    Follow up:  4 months  Labs: please stop by the lab during the week to get your blood drawn (CBC, CMP, TSH, Lipid profile, HgA1c, Vit D)    Please continue to a heart-healthy diet and increase your physical activities. Try to exercise for at least five days a week.    It was a pleasure to see you and I look forward to continuing to work together on your health and well-being. Please do not hesitate to call the office if you need care or have questions about your care.  In case of emergency, please visit the Emergency Department for urgent care, or contact our clinic at [phone] to schedule an appointment. We're here to help you!   Have a wonderful day and week. With Gratitude, Gilmore Laroche MSN, FNP-BC

## 2023-10-22 NOTE — Progress Notes (Signed)
Acute Office Visit  Subjective:    Patient ID: Denise Ford, female    DOB: 28-Aug-1961, 62 y.o.   MRN: [id number]  Chief Complaint  Patient presents with   Anxiety    States her husband just recently passed away and she has been taking the xanax once daily but she has to take 2 for it to have any effect. The hydroxyzine does help her sleep but she doesn't know if she can take both together     HPI Patient is in today for anxiety.  Generalized anxiety disorder: The patient is currently living alone since the passing of her husband a few weeks ago.  She reports doing well however she has noticed that she has been taking 2 tablets Xanax for symptomatic relief of her anxiety and panic attacks.  She has been also taking hydroxyzine at bedtime to help her sleep.  She declines suicidal thoughts and ideation.  She declines a referral to talk therapy currently.  She reports that she has adequate support from her siblings and husband's family as she is grieving through this process.  She reports that she kept the ashes of her husband and often communicate to him.  No suicidal thoughts ideation reported.  She is requesting refill of Zoloft 50 mg daily today  Vitamin D deficiency: The patient is requesting refill for vitamin D supplement today.  Past Medical History:  Diagnosis Date   Anxiety    Hyperlipidemia    Vocal cord polyps     Past Surgical History:  Procedure Laterality Date   CESAREAN SECTION     x2   COLONOSCOPY WITH PROPOFOL N/A 07/18/2022   Procedure: COLONOSCOPY WITH PROPOFOL;  Surgeon: Corbin Ade, MD;  [facility];  Service: Endoscopy;  Laterality: N/A;  12:45pm   ESOPHAGOGASTRODUODENOSCOPY (EGD) WITH PROPOFOL N/A 03/24/2022   Procedure: ESOPHAGOGASTRODUODENOSCOPY (EGD) WITH PROPOFOL;  Surgeon: Corbin Ade, MD;  [facility]: [facility];  Service: Endoscopy;  Laterality: N/A;  1:30pm   MALONEY DILATION N/A 03/24/2022   Procedure: Elease Hashimoto DILATION;  Surgeon:  Corbin Ade, MD;  [facility]: [facility];  Service: Endoscopy;  Laterality: N/A;   POLYPECTOMY  07/18/2022   Procedure: POLYPECTOMY;  Surgeon: Corbin Ade, MD;  [facility]: [facility];  Service: Endoscopy;;   TUBAL LIGATION      Family History  Problem Relation Age of Onset   Hypertension Mother    COPD Mother    Cancer Mother        thoat cancer   Hypertension Father    Cancer Paternal Aunt        pancreatic cancer   Cancer Paternal Uncle    Colon cancer Neg Hx     Social History   Socioeconomic History   Marital status: Married    Spouse name: Not on file   Number of children: Not on file   Years of education: Not on file   Highest education level: Not on file  Occupational History   Not on file  Tobacco Use   Smoking status: Every Day    Current packs/day: 0.50    Average packs/day: 0.5 packs/day for 30.0 years (15.0 ttl pk-yrs)    Types: Cigarettes    Passive exposure: Current   Smokeless tobacco: Never  Vaping Use   Vaping status: Never Used  Substance and Sexual Activity   Alcohol use: Not Currently    Comment: prior heavy etoh use/ seldom   Drug use: Yes  Frequency: 2.0 times per week    Types: Marijuana    Comment: occ; last used 03/23/22   Sexual activity: Not Currently  Other Topics Concern   Not on file  Social History Narrative   Not on file   Social Determinants of Health   Financial Resource Strain: Not on file  Food Insecurity: Not on file  Transportation Needs: Not on file  Physical Activity: Not on file  Stress: Not on file  Social Connections: Not on file  Intimate Partner Violence: Not on file    Outpatient Medications Prior to Visit  Medication Sig Dispense Refill   atorvastatin (LIPITOR) 20 MG tablet Take 1 tablet by mouth once daily 90 tablet 0   cyanocobalamin (VITAMIN B12) 1000 MCG tablet Take 1 tablet (1,000 mcg total) by mouth daily. 90 tablet 0   hydrOXYzine (VISTARIL) 25 MG capsule TAKE 1 CAPSULE BY MOUTH EVERY 8  HOURS AS NEEDED 30 capsule 0   Omega-3 Fatty Acids (FISH OIL PO) Take 1 tablet by mouth daily.     omeprazole (PRILOSEC) 40 MG capsule Take 1 capsule (40 mg total) by mouth in the morning and at bedtime. 180 capsule 0   VENTOLIN HFA 108 (90 Base) MCG/ACT inhaler INHALE 2 PUFFS BY MOUTH EVERY 6 HOURS AS NEEDED FOR COUGHING, WHEEZING, OR SHORTNESS OF BREATH 18 g 0   ALPRAZolam (XANAX) 0.25 MG tablet Take 1 tablet (0.25 mg total) by mouth as needed for anxiety. 15 tablet 0   sertraline (ZOLOFT) 50 MG tablet Take 1 tablet by mouth once daily 30 tablet 0   Vitamin D, Ergocalciferol, (DRISDOL) 1.25 MG (50000 UNIT) CAPS capsule Take 1 capsule (50,000 Units total) by mouth every 7 (seven) days. 10 capsule 1   No facility-administered medications prior to visit.    No Known Allergies  Review of Systems  Constitutional:  Negative for chills and fever.  Eyes:  Negative for visual disturbance.  Respiratory:  Negative for chest tightness and shortness of breath.   Neurological:  Negative for dizziness and headaches.       Objective:    Physical Exam HENT:     Head: Normocephalic.     Mouth/Throat:     Mouth: Mucous membranes are moist.  Cardiovascular:     Rate and Rhythm: Normal rate.     Heart sounds: Normal heart sounds.  Pulmonary:     Effort: Pulmonary effort is normal.     Breath sounds: Normal breath sounds.  Neurological:     Mental Status: She is alert.     BP 125/80   Pulse 75   Resp 16   Ht 5\' 4"  (1.626 m)   Wt 158 lb 1.9 oz (71.7 kg)   SpO2 93%   BMI 27.14 kg/m  Wt Readings from Last 3 Encounters:  10/22/23 158 lb 1.9 oz (71.7 kg)  10/19/23 160 lb (72.6 kg)  07/13/23 162 lb 1.9 oz (73.5 kg)       Assessment & Plan:  GAD (generalized anxiety disorder) Assessment & Plan: Refill Zoloft 50 mg The patient was encouraged to take Xanax 0.5 mg up to 3 times daily for anxiety management. She was informed that the therapy with Xanax will be discontinued after 3 months,  as it is intended for short-term use to help her cope during this difficult time. The patient verbalized understanding.  She was also encouraged to discontinue hydroxyzine, as combining it with Xanax increases the risk of respiratory depression. The patient verbalized understanding.  Coping  skills, including journaling, mindfulness, and meditation, were discussed to help her manage her anxiety and cope with the loss of her husband. The patient verbalized understanding.   Orders: -     ALPRAZolam; Take 1 tablet (0.5 mg total) by mouth 3 (three) times daily as needed for anxiety.  Dispense: 30 tablet; Refill: 0 -     Sertraline HCl; Take 1 tablet (50 mg total) by mouth daily.  Dispense: 30 tablet; Refill: 2  Vitamin D deficiency Assessment & Plan: Refill sent to the pharmacy  Orders: -     Vitamin D (Ergocalciferol); Take 1 capsule (50,000 Units total) by mouth every 7 (seven) days.  Dispense: 20 capsule; Refill: 1 -     VITAMIN D 25 Hydroxy (Vit-D Deficiency, Fractures)  Depression, recurrent (HCC) -     Sertraline HCl; Take 1 tablet (50 mg total) by mouth daily.  Dispense: 30 tablet; Refill: 2  IFG (impaired fasting glucose) -     Hemoglobin A1c  TSH (thyroid-stimulating hormone deficiency) -     TSH + free T4  Other hyperlipidemia -     Lipid panel -     CMP14+EGFR -     CBC with Differential/Platelet  Note: This chart has been completed using Engineer, civil (consulting) software, and while attempts have been made to ensure accuracy, certain words and phrases may not be transcribed as intended.    Gilmore Laroche, FNP

## 2023-10-22 NOTE — Assessment & Plan Note (Signed)
Refill sent to the pharmacy 

## 2023-10-30 ENCOUNTER — Other Ambulatory Visit: Payer: Self-pay | Admitting: Family Medicine

## 2023-10-30 DIAGNOSIS — F411 Generalized anxiety disorder: Secondary | ICD-10-CM

## 2023-11-03 ENCOUNTER — Telehealth: Payer: Self-pay | Admitting: Family Medicine

## 2023-11-03 ENCOUNTER — Other Ambulatory Visit: Payer: Self-pay | Admitting: Family Medicine

## 2023-11-03 NOTE — Telephone Encounter (Signed)
Prescription Request  11/03/2023  LOV: 10/22/2023  What is the name of the medication or equipment? ALPRAZolam (XANAX) 0.5 MG tablet [[id number]]    Have you contacted your pharmacy to request a refill? Yes   Which pharmacy would you like this sent to?  [facility] [address], [place] - [address] [place] [address] Phone: [phone] Fax: [phone]    Patient notified that their request is being sent to the clinical staff for review and that they should receive a response within 2 business days.   Please advise at Mobile [phone] (mobile)

## 2023-11-03 NOTE — Telephone Encounter (Signed)
Kindly inform the patient that her prescription was recently refilled on 10/22/23.

## 2023-11-04 ENCOUNTER — Other Ambulatory Visit: Payer: Self-pay | Admitting: Family Medicine

## 2023-11-04 ENCOUNTER — Ambulatory Visit (HOSPITAL_COMMUNITY): Payer: Medicaid Other

## 2023-11-04 DIAGNOSIS — F411 Generalized anxiety disorder: Secondary | ICD-10-CM

## 2023-11-04 MED ORDER — ALPRAZOLAM 0.5 MG PO TABS
0.5000 mg | ORAL_TABLET | ORAL | 0 refills | Status: DC | PRN
Start: 2023-11-04 — End: 2023-11-25

## 2023-11-04 NOTE — Telephone Encounter (Signed)
Rx sent 

## 2023-11-04 NOTE — Telephone Encounter (Signed)
Unable to leave a message.

## 2023-11-04 NOTE — Telephone Encounter (Signed)
States she is out has been taking two a day, the rx has 3x prn, but has been doing 2x prn ,states she has been dealing with a lot since her husbands passing

## 2023-11-10 ENCOUNTER — Ambulatory Visit (HOSPITAL_COMMUNITY)
Admission: RE | Admit: 2023-11-10 | Discharge: 2023-11-10 | Disposition: A | Discharge status: Home / Self Care | Payer: Medicaid Other | Source: Ambulatory Visit | Attending: "Endocrinology | Admitting: "Endocrinology

## 2023-11-10 DIAGNOSIS — E049 Nontoxic goiter, unspecified: Secondary | ICD-10-CM | POA: Diagnosis not present

## 2023-11-10 DIAGNOSIS — E042 Nontoxic multinodular goiter: Secondary | ICD-10-CM | POA: Diagnosis not present

## 2023-11-19 ENCOUNTER — Other Ambulatory Visit: Payer: Self-pay | Admitting: Family Medicine

## 2023-11-24 ENCOUNTER — Other Ambulatory Visit: Payer: Self-pay | Admitting: Family Medicine

## 2023-11-24 DIAGNOSIS — F411 Generalized anxiety disorder: Secondary | ICD-10-CM

## 2023-11-25 ENCOUNTER — Other Ambulatory Visit: Payer: Self-pay | Admitting: Family Medicine

## 2023-11-25 DIAGNOSIS — F411 Generalized anxiety disorder: Secondary | ICD-10-CM

## 2023-11-25 MED ORDER — ALPRAZOLAM 0.5 MG PO TABS
0.5000 mg | ORAL_TABLET | ORAL | 0 refills | Status: DC | PRN
Start: 2023-11-25 — End: 2023-12-14

## 2023-12-09 NOTE — Progress Notes (Unsigned)
Denise Ford, female    DOB: 02-07-1961    MRN: [id number]   Brief patient profile:  84  yowf  active smoker  referred to pulmonary clinic in [place]  12/10/2023 by Gilmore Laroche NP  for cough and doe  x fall 2023.   Hoarse x her whole life with last [facility] eval 03/2022 pos polyps     History of Present Illness  12/10/2023  Pulmonary/ 1st office eval/ [facility]i Albanese / Central Office on prn saba  Chief Complaint  Patient presents with   Establish Care   Shortness of Breath   Dyspnea:  50 ft downhill to mb and out of breath when gets  to  house  Cough: white in am / 30 min  Sleep: flat bed one pillow/ once   month  SABA use: avg twice a day / after ex  02: none  Lung cancer screen: per PCP   No obvious day to day or daytime pattern/variability or assoc  purulent sputum or mucus plugs or hemoptysis or cp or chest tightness, subjective wheeze or overt sinus or hb symptoms.    Also denies any obvious fluctuation of symptoms with weather or environmental changes or other aggravating or alleviating factors except as outlined above   No unusual exposure hx or h/o childhood pna/ asthma or knowledge of premature birth.  Current Allergies, Complete Past Medical History, Past Surgical History, Family History, and Social History were reviewed in [employer] record.  ROS  The following are not active complaints unless bolded Hoarseness, sore throat, dysphagia/globus , dental problems, itching, sneezing,  nasal congestion or discharge of excess mucus or purulent secretions, ear ache,   fever, chills, sweats, unintended wt loss or wt gain, classically pleuritic or exertional cp,  orthopnea pnd or arm/hand swelling  or leg swelling, presyncope, palpitations, abdominal pain, anorexia, nausea, vomiting, diarrhea  or change in bowel habits or change in bladder habits, change in stools or change in urine, dysuria, hematuria,  rash, arthralgias, visual complaints, headache,  numbness, weakness or ataxia or problems with walking or coordination,  change in mood or  memory.            Outpatient Medications Prior to Visit  Medication Sig Dispense Refill   ALPRAZolam (XANAX) 0.5 MG tablet Take 1 tablet (0.5 mg total) by mouth as needed for anxiety. 10 tablet 0   atorvastatin (LIPITOR) 20 MG tablet Take 1 tablet by mouth once daily 90 tablet 0   cyanocobalamin (VITAMIN B12) 1000 MCG tablet Take 1 tablet (1,000 mcg total) by mouth daily. 90 tablet 0   hydrOXYzine (VISTARIL) 25 MG capsule TAKE 1 CAPSULE BY MOUTH EVERY 8 HOURS AS NEEDED 30 capsule 0   Omega-3 Fatty Acids (FISH OIL PO) Take 1 tablet by mouth daily.     omeprazole (PRILOSEC) 40 MG capsule Take 1 capsule (40 mg total) by mouth in the morning and at bedtime. 180 capsule 0   sertraline (ZOLOFT) 50 MG tablet Take 1 tablet (50 mg total) by mouth daily. 30 tablet 2   VENTOLIN HFA 108 (90 Base) MCG/ACT inhaler INHALE 2 PUFFS BY MOUTH EVERY 6 HOURS AS NEEDED FOR COUGHING, WHEEZING, OR SHORTNESS OF BREATH 18 g 0   Vitamin D, Ergocalciferol, (DRISDOL) 1.25 MG (50000 UNIT) CAPS capsule Take 1 capsule (50,000 Units total) by mouth every 7 (seven) days. 20 capsule 1   No facility-administered medications prior to visit.    Past Medical History:  Diagnosis Date   Anxiety  Hyperlipidemia    Vocal cord polyps       Objective:     BP 126/78   Pulse 68   Ht 5\' 4"  (1.626 m)   Wt 148 lb (67.1 kg)   SpO2 94%   BMI 25.40 kg/m   SpO2: 94 % RA  Hoarse  amb pleasant wf nad    HEENT : Oropharynx  clear   Nasal turbinates nl    NECK :  without  apparent JVD/ palpable Nodes/TM    LUNGS: no acc muscle use,  Min barrel  contour chest wall with bilateral  slightly decreased bs s audible wheeze and  without cough on insp or exp maneuvers and min  Hyperresonant  to  percussion bilaterally    CV:  RRR  no s3 or murmur or increase in P2, and no edema   ABD:  soft and nontender with pos end  insp Hoover's  in  the supine position.  No bruits or organomegaly appreciated   MS:  Nl gait/ ext warm without deformities Or obvious joint restrictions  calf tenderness, cyanosis or clubbing     SKIN: warm and dry without lesions    NEURO:  alert, approp, nl sensorium with  no motor or cerebellar deficits apparent.            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 - 12/10/2023  After extensive coaching inhaler device,  effectiveness =  near 0 try symbicort 80 1-2 bid  - 12/10/2023   Walked on RA  x  3  lap(s) =  approx 450  ft  @ mod pace, stopped due to end of study s sob  with lowest 02 sats 91   The bad new is that her ability to inhale her meds is quite poor but the good is that I believe she has relatively mild copd/AB  so for now rec symbicort 80 2bid (lower dose less likely to irritate upper airway)  with approp saba:  Re SABA :  I spent extra time with pt today reviewing appropriate use of albuterol for prn use on exertion with the following points: 1) saba is for relief of sob that does not improve by walking a slower pace or resting but rather if the pt does not improve after trying this first. 2) If the pt is convinced, as many are, that saba helps recover from activity faster then it's easy to tell if this is the case by re-challenging : ie stop, take the inhaler, then p 5 minutes try the exact same activity (intensity of workload) that just caused the symptoms and see if they are substantially diminished or not after saba 3) if there is an activity that reproducibly causes the symptoms, try the saba 15 min before the activity on alternate days   If in fact the saba really does help, then fine to continue to use it prn but advised may need to look closer at the maintenance regimen being used to achieve better control of airways disease with exertion.    Hoarseness Referred to [facility] (had seen [facility] last in 03/2022 with dx polyps)  so referred to [facility]  12/10/2023 >>>   Discussed in detail all the  indications, usual  risks and alternatives  relative to the benefits with patient who agrees to proceed with w/u as outlined.  Each maintenance medication was reviewed in detail including emphasizing most importantly the difference between maintenance and prns and under what circumstances the prns are to be triggered using an action plan format where appropriate.  Total time for H and P, chart review, counseling, reviewing hfa device(s) , directly observing portions of ambulatory 02 saturation study/ and generating customized AVS unique to this office visit / same day charting = 45 min new pt eval                         Cigarette smoker 4-5 min discussion re active cigarette smoking in addition to office E&M  Ask about tobacco use:   ongoing  Advise quitting  I took an extended  opportunity with this patient to outline the consequences of continued cigarette use  in airway disorders based on all the data we have from the multiple national lung health studies (perfomed over decades at millions of dollars in cost)  indicating that smoking cessation, not choice of inhalers or pulmonary physicians, is the most important aspect of her care.   Assess willingness:  Not committed at this point Assist in quit attempt:  Per PCP when ready Arrange follow up:   Follow up per Primary Care planned        Sandrea Hughs, MD 12/10/2023

## 2023-12-10 ENCOUNTER — Other Ambulatory Visit: Payer: Self-pay | Admitting: Family Medicine

## 2023-12-10 ENCOUNTER — Ambulatory Visit: Payer: Medicaid Other | Admitting: Internal Medicine

## 2023-12-10 ENCOUNTER — Encounter: Payer: Self-pay | Admitting: Internal Medicine

## 2023-12-10 VITALS — BP 126/78 | HR 68 | Ht 64.0 in | Wt 148.0 lb

## 2023-12-10 DIAGNOSIS — R490 Dysphonia: Secondary | ICD-10-CM | POA: Diagnosis not present

## 2023-12-10 DIAGNOSIS — J449 Chronic obstructive pulmonary disease, unspecified: Secondary | ICD-10-CM | POA: Insufficient documentation

## 2023-12-10 DIAGNOSIS — F17210 Nicotine dependence, cigarettes, uncomplicated: Secondary | ICD-10-CM

## 2023-12-10 DIAGNOSIS — F411 Generalized anxiety disorder: Secondary | ICD-10-CM

## 2023-12-10 MED ORDER — BREZTRI AEROSPHERE 160-9-4.8 MCG/ACT IN AERO: 2.0000 | INHALATION_SPRAY | Freq: Two times a day (BID) | RESPIRATORY_TRACT | Status: DC

## 2023-12-10 MED ORDER — BUDESONIDE-FORMOTEROL FUMARATE 80-4.5 MCG/ACT IN AERO: INHALATION_SPRAY | RESPIRATORY_TRACT | 12 refills | Status: DC

## 2023-12-10 NOTE — Assessment & Plan Note (Signed)

## 2023-12-10 NOTE — Assessment & Plan Note (Addendum)
Active smoker -  LDSCT  08/25/23  Mild diffuse bronchial wall thickening with mild centrilobular and paraseptal emphysema. - 12/10/2023  After extensive coaching inhaler device,  effectiveness =  near 0 try symbicort 80 1-2 bid  - 12/10/2023   Walked on RA  x  3  lap(s) =  approx 450  ft  @ mod pace, stopped due to end of study s sob  with lowest 02 sats 91   The bad new is that her ability to inhale her meds is quite poor but the good is that I believe she has relatively mild copd/AB  so for now rec symbicort 80 2bid (lower dose less likely to irritate upper airway)  with approp saba:  Re SABA :  I spent extra time with pt today reviewing appropriate use of albuterol for prn use on exertion with the following points: 1) saba is for relief of sob that does not improve by walking a slower pace or resting but rather if the pt does not improve after trying this first. 2) If the pt is convinced, as many are, that saba helps recover from activity faster then it's easy to tell if this is the case by re-challenging : ie stop, take the inhaler, then p 5 minutes try the exact same activity (intensity of workload) that just caused the symptoms and see if they are substantially diminished or not after saba 3) if there is an activity that reproducibly causes the symptoms, try the saba 15 min before the activity on alternate days   If in fact the saba really does help, then fine to continue to use it prn but advised may need to look closer at the maintenance regimen being used to achieve better control of airways disease with exertion.

## 2023-12-10 NOTE — Assessment & Plan Note (Addendum)
Referred to [facility] (had seen [facility] last in 03/2022 with dx polyps)  so referred to [facility]  12/10/2023 >>>   Discussed in detail all the  indications, usual  risks and alternatives  relative to the benefits with patient who agrees to proceed with w/u as outlined.      Each maintenance medication was reviewed in detail including emphasizing most importantly the difference between maintenance and prns and under what circumstances the prns are to be triggered using an action plan format where appropriate.  Total time for H and P, chart review, counseling, reviewing hfa device(s) , directly observing portions of ambulatory 02 saturation study/ and generating customized AVS unique to this office visit / same day charting = 45 min new pt eval

## 2023-12-10 NOTE — Addendum Note (Signed)
Addended by: Shelby Dubin on: 12/10/2023 03:59 PM   Modules accepted: Orders

## 2023-12-10 NOTE — Patient Instructions (Addendum)
My office will be contacting you by phone for referral to [facility]  and PFTs [facility] - if you don't hear back from my office within one week please call us back or notify us thru MyChart and we'll address it right away.    Plan A = Automatic = Always=   Symbicort 80 Take 2 puffs first thing in am and then another 2 puffs about 12 hours later.    Work on inhaler technique:  relax and gently blow all the way out then take a nice smooth full deep breath back in, triggering the inhaler at same time you start breathing in.  Hold breath in for at least  5 seconds if you can. Blow out symbicort  thru nose. Rinse and gargle with water when done.  If mouth or throat bother you at all,  try brushing teeth/gums/tongue with arm and hammer toothpaste/ make a slurry and gargle and spit out.   >>>  Remember how golfers warm up by taking practice swings - do this with an empty inhaler   Plan B = Backup (to supplement plan A, not to replace it) Only use your albuterol inhaler as a rescue medication to be used if you can't catch your breath by resting or doing a relaxed purse lip breathing pattern.  - The less you use it, the better it will work when you need it. - Ok to use the inhaler up to 2 puffs  every 4 hours if you must but call for appointment if use goes up over your usual need - Don't leave home without it !!  (think of it like the spare tire for your car)   The key is to stop smoking completely before smoking completely stops you!     Please schedule a follow up office visit in 6 weeks, call sooner if needed - bring inhalers

## 2023-12-11 ENCOUNTER — Telehealth: Payer: Self-pay

## 2023-12-11 NOTE — Telephone Encounter (Signed)
Copied from CRM [id number]. Topic: Clinical - Medication Question >> Dec 11, 2023  8:32 AM Larwance Sachs wrote: Reason for CRM: Patient called in regarding ALPRAZolam Denise Ford) 0.5 MG tablet prescription, stated when she first started taking medication she was prescribed a quantity of 30 that then went down to a quantity of 20. Patient stated quantity was decreased again to a quantity of 10. Patient is wanting to know exactly why prescription quantity is being decreased and if it can be returned to a quantity of 30, stating if medication is returned to regular quantity she would be saving financially.  She is out of medication and has been for 2 days, stated the medication has been help her eat and sleep which has been hard for her.  Please call back at [phone]

## 2023-12-12 ENCOUNTER — Other Ambulatory Visit: Payer: Self-pay | Admitting: Family Medicine

## 2023-12-14 ENCOUNTER — Telehealth: Payer: Self-pay

## 2023-12-14 ENCOUNTER — Other Ambulatory Visit: Payer: Self-pay | Admitting: Family Medicine

## 2023-12-14 DIAGNOSIS — F411 Generalized anxiety disorder: Secondary | ICD-10-CM

## 2023-12-14 MED ORDER — ALPRAZOLAM 0.5 MG PO TABS: 0.5000 mg | ORAL_TABLET | ORAL | 0 refills | Status: DC | PRN

## 2023-12-14 NOTE — Telephone Encounter (Signed)
  Please inform the patient that I plan to begin weaning her off alprazolam, as the medication is indicated for short-term use only. I can prescribe 20 tablets per month and will be unable to refill the prescription early if she runs out before the next month, to reduce her risk of dependency and addiction. Her current refill has been sent to the pharmacy.

## 2023-12-14 NOTE — Telephone Encounter (Signed)
Please advice  

## 2023-12-14 NOTE — Telephone Encounter (Signed)
Copied from CRM [id number]. Topic: Clinical - Medical Advice >> Dec 14, 2023 10:50 AM Sasha H wrote: Reason for CRM: pt would like a call back in regards to VENTOLIN HFA 108 (90 Base) MCG/ACT inhaler. She wants to know if she should continue since being put on a different inhaler by a different dr

## 2023-12-14 NOTE — Telephone Encounter (Signed)
Kindly inform the patient that the albuterol inhaler is to be used as needed for quick relief of symptoms. The inhaler prescribed by Dr. Loura Back is your daily maintenance inhaler and should be used regularly as directed.

## 2023-12-15 ENCOUNTER — Other Ambulatory Visit: Payer: Self-pay | Admitting: Family Medicine

## 2023-12-15 DIAGNOSIS — K2100 Gastro-esophageal reflux disease with esophagitis, without bleeding: Secondary | ICD-10-CM

## 2023-12-15 NOTE — Telephone Encounter (Signed)
Spoke to pt let her know. 

## 2023-12-15 NOTE — Telephone Encounter (Signed)
I have spoke to pt and informed her of this.

## 2023-12-29 ENCOUNTER — Encounter (INDEPENDENT_AMBULATORY_CARE_PROVIDER_SITE_OTHER): Payer: Self-pay | Admitting: Otolaryngology

## 2024-01-02 ENCOUNTER — Other Ambulatory Visit: Payer: Self-pay | Admitting: Family Medicine

## 2024-01-15 ENCOUNTER — Other Ambulatory Visit: Payer: Self-pay | Admitting: Family Medicine

## 2024-01-15 DIAGNOSIS — F411 Generalized anxiety disorder: Secondary | ICD-10-CM

## 2024-01-21 ENCOUNTER — Institutional Professional Consult (permissible substitution) (INDEPENDENT_AMBULATORY_CARE_PROVIDER_SITE_OTHER): Payer: Medicaid Other | Admitting: Otolaryngology

## 2024-01-25 ENCOUNTER — Other Ambulatory Visit: Payer: Self-pay | Admitting: Family Medicine

## 2024-01-25 DIAGNOSIS — F411 Generalized anxiety disorder: Secondary | ICD-10-CM

## 2024-01-25 DIAGNOSIS — F339 Major depressive disorder, recurrent, unspecified: Secondary | ICD-10-CM

## 2024-01-26 ENCOUNTER — Other Ambulatory Visit: Payer: Self-pay | Admitting: Family Medicine

## 2024-01-28 ENCOUNTER — Ambulatory Visit: Payer: Medicaid Other | Admitting: Internal Medicine

## 2024-02-02 ENCOUNTER — Ambulatory Visit (HOSPITAL_COMMUNITY): Admission: RE | Admit: 2024-02-02 | Payer: Medicaid Other | Source: Ambulatory Visit

## 2024-02-04 ENCOUNTER — Ambulatory Visit: Payer: Medicaid Other | Admitting: Internal Medicine

## 2024-02-04 ENCOUNTER — Telehealth: Payer: Self-pay | Admitting: Internal Medicine

## 2024-02-04 NOTE — Progress Notes (Deleted)
 Denise Ford, female    DOB: [birth year]    MRN: [id number]   Brief patient profile:  4  yowf  active smoker  referred to pulmonary clinic in [place]  12/10/2023 by Gloria Zarwolo NP  for cough and doe  x fall 2023.   Hoarse x her whole life with last Wolicki eval 03/2022 pos polyps     History of Present Illness  12/10/2023  Pulmonary/ 1st Ford eval/ Denise Ford / [place] [facility] on prn saba  Chief Complaint  Patient presents with   Establish Care   Shortness of Breath   Dyspnea:  50 ft downhill to mb and out of breath when gets  to  house  Cough: white in am / 30 min  Sleep: flat bed one pillow/ once   month  SABA use: avg twice a day / after ex  02: none  Lung cancer screen: per PCP Rec My Ford will be contacting you by phone for referral to [facility]  and PFTs [facility] - if you don't hear back from my Ford within one week please call us  back or notify us  thru MyChart and we'll address it right away.  Plan A = Automatic = Always=   Symbicort  80 Take 2 puffs first thing in am and then another 2 puffs about 12 hours later.  Work on inhaler technique: >>>  Remember how golfers warm up by taking practice swings - do this with an empty inhaler  Plan Ford = Backup (to supplement plan A, not to replace it) Only use your albuterol  inhaler as a rescue medication  The key is to stop smoking completely before smoking completely stops you!   Please schedule a follow up Ford visit in 6 weeks, call sooner if needed - bring inhalers   02/04/2024  f/u ov/Denise Ford/Denise Ford re: *** maint on *** did *** bring inhalers No chief complaint on file.   Dyspnea:  *** Cough: *** Sleeping: ***   resp cc  SABA use: *** 02: ***  Lung cancer screening: ***   No obvious day to day or daytime variability or assoc excess/ purulent sputum or mucus plugs or hemoptysis or cp or chest tightness, subjective wheeze or overt sinus or hb symptoms.    Also denies any obvious fluctuation of symptoms  with weather or environmental changes or other aggravating or alleviating factors except as outlined above   No unusual exposure hx or h/o childhood pna/ asthma or knowledge of premature birth.  Current Allergies, Complete Past Medical History, Past Surgical History, Family History, and Social History were reviewed in [employer] record.  ROS  The following are not active complaints unless bolded Hoarseness, sore throat, dysphagia, dental problems, itching, sneezing,  nasal congestion or discharge of excess mucus or purulent secretions, ear ache,   fever, chills, sweats, unintended wt loss or wt gain, classically pleuritic or exertional cp,  orthopnea pnd or arm/hand swelling  or leg swelling, presyncope, palpitations, abdominal pain, anorexia, nausea, vomiting, diarrhea  or change in bowel habits or change in bladder habits, change in stools or change in urine, dysuria, hematuria,  rash, arthralgias, visual complaints, headache, numbness, weakness or ataxia or problems with walking or coordination,  change in mood or  memory.        No outpatient medications have been marked as taking for the 02/04/24 encounter (Appointment) with Denise Leeson B, MD.         Past Medical History:  Diagnosis Date   Anxiety  Hyperlipidemia    Vocal cord polyps       Objective:    Wts    02/04/2024          ***  12/10/23 148 lb (67.1 kg)  10/22/23 158 lb 1.9 oz (71.7 kg)  10/19/23 160 lb (72.6 kg)      Vital signs reviewed  02/04/2024  - Note at rest 02 sats  ***% on ***   General appearance:    ***        Min barr***          Assessment

## 2024-02-04 NOTE — Telephone Encounter (Signed)
 Spoke with patient regarding the new appointment date and time for the PFT at [facility] Thursday 03/10/24 at 11:30 am-arrival time is 11:15 am---1st floor registration desk---follow up appointment with Dr. Darlean  03/10/24 at 1:45 pm.  Will mail information to patient and she voiced her understanding

## 2024-02-15 ENCOUNTER — Other Ambulatory Visit: Payer: Self-pay | Admitting: Family Medicine

## 2024-02-15 DIAGNOSIS — F411 Generalized anxiety disorder: Secondary | ICD-10-CM

## 2024-02-22 ENCOUNTER — Encounter: Payer: Self-pay | Admitting: Family Medicine

## 2024-02-22 ENCOUNTER — Ambulatory Visit (INDEPENDENT_AMBULATORY_CARE_PROVIDER_SITE_OTHER): Payer: Medicaid Other | Admitting: Family Medicine

## 2024-02-22 ENCOUNTER — Other Ambulatory Visit: Payer: Self-pay | Admitting: Family Medicine

## 2024-02-22 VITALS — BP 107/72 | HR 70 | Ht 64.0 in | Wt 144.1 lb

## 2024-02-22 DIAGNOSIS — F339 Major depressive disorder, recurrent, unspecified: Secondary | ICD-10-CM | POA: Diagnosis not present

## 2024-02-22 DIAGNOSIS — Z23 Encounter for immunization: Secondary | ICD-10-CM

## 2024-02-22 DIAGNOSIS — F411 Generalized anxiety disorder: Secondary | ICD-10-CM | POA: Diagnosis not present

## 2024-02-22 DIAGNOSIS — R7301 Impaired fasting glucose: Secondary | ICD-10-CM

## 2024-02-22 DIAGNOSIS — K2100 Gastro-esophageal reflux disease with esophagitis, without bleeding: Secondary | ICD-10-CM

## 2024-02-22 DIAGNOSIS — E538 Deficiency of other specified B group vitamins: Secondary | ICD-10-CM

## 2024-02-22 DIAGNOSIS — E559 Vitamin D deficiency, unspecified: Secondary | ICD-10-CM

## 2024-02-22 DIAGNOSIS — G479 Sleep disorder, unspecified: Secondary | ICD-10-CM | POA: Diagnosis not present

## 2024-02-22 DIAGNOSIS — E038 Other specified hypothyroidism: Secondary | ICD-10-CM

## 2024-02-22 DIAGNOSIS — E785 Hyperlipidemia, unspecified: Secondary | ICD-10-CM

## 2024-02-22 DIAGNOSIS — E7849 Other hyperlipidemia: Secondary | ICD-10-CM

## 2024-02-22 MED ORDER — SERTRALINE HCL 50 MG PO TABS: 50.0000 mg | ORAL_TABLET | Freq: Every day | ORAL | 1 refills | Status: DC

## 2024-02-22 MED ORDER — ATORVASTATIN CALCIUM 20 MG PO TABS: ORAL_TABLET | ORAL | 0 refills | Status: DC

## 2024-02-22 MED ORDER — VITAMIN D (ERGOCALCIFEROL) 1.25 MG (50000 UNIT) PO CAPS: 50000.0000 [IU] | ORAL_CAPSULE | ORAL | 1 refills | Status: DC

## 2024-02-22 MED ORDER — HYDROXYZINE PAMOATE 25 MG PO CAPS: ORAL_CAPSULE | ORAL | 0 refills | Status: DC

## 2024-02-22 NOTE — Progress Notes (Unsigned)
 Established Patient Office Visit  Subjective:  Patient ID: Denise Ford, female    DOB: [birth year]  Age: 63 y.o. MRN: [id number]  CC:  Chief Complaint  Patient presents with   Follow-up    4 month    HPI Denise Ford is a 63 y.o. female with past medical history of GAD, Hyperlipidemia, vit d deficiency presents for f/u of  chronic medical conditions. For the details of today's visit, please refer to the assessment and plan.     Past Medical History:  Diagnosis Date   Anxiety    Hyperlipidemia    Vocal cord polyps     Past Surgical History:  Procedure Laterality Date   CESAREAN SECTION     x2   COLONOSCOPY WITH PROPOFOL N/A 07/18/2022   Procedure: COLONOSCOPY WITH PROPOFOL;  Surgeon: Corbin Ade, MD;  [facility];  Service: Endoscopy;  Laterality: N/A;  12:45pm   ESOPHAGOGASTRODUODENOSCOPY (EGD) WITH PROPOFOL N/A 03/24/2022   Procedure: ESOPHAGOGASTRODUODENOSCOPY (EGD) WITH PROPOFOL;  Surgeon: Corbin Ade, MD;  Location: [facility];  Service: Endoscopy;  Laterality: N/A;  1:30pm   MALONEY DILATION N/A 03/24/2022   Procedure: Elease Hashimoto DILATION;  Surgeon: Corbin Ade, MD;  Location: [facility];  Service: Endoscopy;  Laterality: N/A;   POLYPECTOMY  07/18/2022   Procedure: POLYPECTOMY;  Surgeon: Corbin Ade, MD;  Location: [facility];  Service: Endoscopy;;   TUBAL LIGATION      Family History  Problem Relation Age of Onset   Hypertension Mother    COPD Mother    Cancer Mother        thoat cancer   Hypertension Father    Cancer Paternal Aunt        pancreatic cancer   Cancer Paternal Uncle    Colon cancer Neg Hx     Social History   Socioeconomic History   Marital status: Married    Spouse name: Not on file   Number of children: Not on file   Years of education: Not on file   Highest education level: Not on file  Occupational History   Not on file  Tobacco Use   Smoking status: Every Day    Current packs/day: 0.25     Average packs/day: 0.3 packs/day for 30.0 years (7.5 ttl pk-yrs)    Types: Cigarettes    Passive exposure: Current   Smokeless tobacco: Never  Vaping Use   Vaping status: Never Used  Substance and Sexual Activity   Alcohol use: Not Currently    Alcohol/week: 10.0 standard drinks of alcohol    Types: 10 Cans of beer per week    Comment: prior heavy etoh use/ seldom   Drug use: Yes    Frequency: 2.0 times per week    Types: Marijuana    Comment: occ; last used 03/23/22   Sexual activity: Not Currently  Other Topics Concern   Not on file  Social History Narrative   Not on file   Social Drivers of Health   Financial Resource Strain: Not on file  Food Insecurity: Not on file  Transportation Needs: Not on file  Physical Activity: Not on file  Stress: Not on file  Social Connections: Not on file  Intimate Partner Violence: Not on file    Outpatient Medications Prior to Visit  Medication Sig Dispense Refill   ALPRAZolam (XANAX) 0.5 MG tablet TAKE 1 TABLET BY MOUTH AS NEEDED FOR ANXIETY 20 tablet 0   Budeson-Glycopyrrol-Formoterol (BREZTRI  AEROSPHERE) 160-9-4.8 MCG/ACT AERO Inhale 2 puffs into the lungs in the morning and at bedtime.     budesonide-formoterol (SYMBICORT) 80-4.5 MCG/ACT inhaler Take 2 puffs first thing in am and then another 2 puffs about 12 hours later. 1 each 12   cyanocobalamin (VITAMIN B12) 1000 MCG tablet Take 1 tablet (1,000 mcg total) by mouth daily. 90 tablet 0   Omega-3 Fatty Acids (FISH OIL PO) Take 1 tablet by mouth daily.     omeprazole (PRILOSEC) 40 MG capsule TAKE 1 CAPSULE BY MOUTH IN THE MORNING AND AT BEDTIME 180 capsule 0   VENTOLIN HFA 108 (90 Base) MCG/ACT inhaler INHALE 2 PUFFS BY MOUTH EVERY 6 HOURS AS NEEDED FOR COUGH FOR WHEEZING OR SHORTNESS OF BREATH 18 g 0   atorvastatin (LIPITOR) 20 MG tablet Take 1 tablet by mouth once daily 90 tablet 0   hydrOXYzine (VISTARIL) 25 MG capsule TAKE 1 CAPSULE BY MOUTH EVERY 8 HOURS AS NEEDED 30 capsule 0    sertraline (ZOLOFT) 50 MG tablet Take 1 tablet by mouth once daily 30 tablet 0   Vitamin D, Ergocalciferol, (DRISDOL) 1.25 MG (50000 UNIT) CAPS capsule Take 1 capsule (50,000 Units total) by mouth every 7 (seven) days. 20 capsule 1   No facility-administered medications prior to visit.    No Known Allergies  ROS Review of Systems  Constitutional:  Negative for chills and fever.  Eyes:  Negative for visual disturbance.  Respiratory:  Negative for chest tightness and shortness of breath.   Neurological:  Negative for dizziness and headaches.      Objective:    Physical Exam HENT:     Head: Normocephalic.     Mouth/Throat:     Mouth: Mucous membranes are moist.  Cardiovascular:     Rate and Rhythm: Normal rate.     Heart sounds: Normal heart sounds.  Pulmonary:     Effort: Pulmonary effort is normal.     Breath sounds: Normal breath sounds.  Neurological:     Mental Status: She is alert.     BP 107/72   Pulse 70   Ht 5\' 4"  (1.626 m)   Wt 144 lb 1.3 oz (65.4 kg)   SpO2 95%   BMI 24.73 kg/m  Wt Readings from Last 3 Encounters:  02/22/24 144 lb 1.3 oz (65.4 kg)  12/10/23 148 lb (67.1 kg)  10/22/23 158 lb 1.9 oz (71.7 kg)    Lab Results  Component Value Date   TSH 1.870 10/16/2023   Lab Results  Component Value Date   WBC 9.9 07/15/2023   HGB 15.3 07/15/2023   HCT 45.4 07/15/2023   MCV 98 (H) 07/15/2023   PLT 259 07/15/2023   Lab Results  Component Value Date   NA 142 07/15/2023   K 4.6 07/15/2023   CO2 26 07/15/2023   GLUCOSE 92 07/15/2023   BUN 13 07/15/2023   CREATININE 0.80 07/15/2023   BILITOT <0.2 07/15/2023   ALKPHOS 86 07/15/2023   AST 25 07/15/2023   ALT 28 07/15/2023   PROT 6.6 07/15/2023   ALBUMIN 4.4 07/15/2023   CALCIUM 10.0 07/15/2023   ANIONGAP 6 11/28/2022   EGFR 84 07/15/2023   Lab Results  Component Value Date   CHOL 216 (H) 07/15/2023   Lab Results  Component Value Date   HDL 76 07/15/2023   Lab Results  Component  Value Date   LDLCALC 106 (H) 07/15/2023   Lab Results  Component Value Date   TRIG 202 (H)  07/15/2023   Lab Results  Component Value Date   CHOLHDL 2.8 07/15/2023   Lab Results  Component Value Date   HGBA1C 5.7 (H) 07/15/2023      Assessment & Plan:  GAD (generalized anxiety disorder) Assessment & Plan: Refill Zoloft 50 mg Denies SI/HI and AVH Coping skills, including journaling, mindfulness, and meditation, were discussed to help her manage her anxiety and cope with the loss of her husband. The patient verbalized understanding.   Orders: -     Sertraline HCl; Take 1 tablet (50 mg total) by mouth daily.  Dispense: 60 tablet; Refill: 1  Hyperlipidemia, unspecified hyperlipidemia type Assessment & Plan: The patient was encouraged to continue taking Lipitor 20 mg daily for cholesterol management. Lifestyle modifications were also discussed, including avoiding simple carbohydrates such as cakes, sweet desserts, ice cream, soda (diet or regular), sweet tea, candies, chips, cookies, store-bought juices, excessive alcohol (more than 1-2 drinks per day), lemonade, artificial sweeteners, donuts, coffee creamers, and sugar-free products. Additionally, the patient was advised to reduce the consumption of greasy, fatty foods and increase physical activity to support cardiovascular health. The patient verbalized understanding and is aware of the plan of care.   Orders: -     Atorvastatin Calcium; Take 1 tablet by mouth once daily  Dispense: 90 tablet; Refill: 0 -     Lipid panel -     CMP14+EGFR -     CBC with Differential/Platelet  Vitamin D deficiency Assessment & Plan: Encouraged to increase his intake of vitamin D-rich foods such as fatty fish (e.g., salmon, mackerel, and sardines), fortified dairy products, egg yolks, and fortified cereals.   Orders: -     Vitamin D (Ergocalciferol); Take 1 capsule (50,000 Units total) by mouth every 7 (seven) days.  Dispense: 20 capsule; Refill:  1 -     VITAMIN D 25 Hydroxy (Vit-D Deficiency, Fractures)  Encounter for immunization Assessment & Plan: Patient educated on CDC recommendation for the vaccine. Verbal consent was obtained from the patient, vaccine administered by nurse, no sign of adverse reactions noted at this time. Patient education on arm soreness and use of tylenol or ibuprofen for this patient  was discussed. Patient educated on the signs and symptoms of adverse effect and advise to contact the office if they occur.   Orders: -     Pneumococcal conjugate vaccine 20-valent  Sleep disturbance -     hydrOXYzine Pamoate; TAKE 1 CAPSULE BY MOUTH EVERY 8 HOURS AS NEEDED  Dispense: 30 capsule; Refill: 0  Depression, recurrent (HCC) -     Sertraline HCl; Take 1 tablet (50 mg total) by mouth daily.  Dispense: 60 tablet; Refill: 1  IFG (impaired fasting glucose) -     Hemoglobin A1c  TSH (thyroid-stimulating hormone deficiency) -     TSH + free T4  Note: This chart has been completed using Engineer, civil (consulting) software, and while attempts have been made to ensure accuracy, certain words and phrases may not be transcribed as intended.    Follow-up: Return in about 4 months (around 06/21/2024).   Gilmore Laroche, FNP

## 2024-02-22 NOTE — Patient Instructions (Addendum)

## 2024-02-24 DIAGNOSIS — Z23 Encounter for immunization: Secondary | ICD-10-CM | POA: Insufficient documentation

## 2024-02-24 NOTE — Assessment & Plan Note (Signed)
 Refill Zoloft 50 mg Denies SI/HI and AVH Coping skills, including journaling, mindfulness, and meditation, were discussed to help her manage her anxiety and cope with the loss of her husband. The patient verbalized understanding.

## 2024-02-24 NOTE — Assessment & Plan Note (Signed)
 Encouraged to increase his intake of vitamin D-rich foods such as fatty fish (e.g., salmon, mackerel, and sardines), fortified dairy products, egg yolks, and fortified cereals.

## 2024-02-24 NOTE — Assessment & Plan Note (Signed)
 The patient was encouraged to continue taking Lipitor 20 mg daily for cholesterol management. Lifestyle modifications were also discussed, including avoiding simple carbohydrates such as cakes, sweet desserts, ice cream, soda (diet or regular), sweet tea, candies, chips, cookies, store-bought juices, excessive alcohol (more than 1-2 drinks per day), lemonade, artificial sweeteners, donuts, coffee creamers, and sugar-free products. Additionally, the patient was advised to reduce the consumption of greasy, fatty foods and increase physical activity to support cardiovascular health. The patient verbalized understanding and is aware of the plan of care.

## 2024-02-24 NOTE — Assessment & Plan Note (Signed)
 Patient educated on CDC recommendation for the vaccine. Verbal consent was obtained from the patient, vaccine administered by nurse, no sign of adverse reactions noted at this time. Patient education on arm soreness and use of tylenol or ibuprofen for this patient  was discussed. Patient educated on the signs and symptoms of adverse effect and advise to contact the office if they occur.

## 2024-02-25 ENCOUNTER — Other Ambulatory Visit: Payer: Self-pay | Admitting: Family Medicine

## 2024-02-25 ENCOUNTER — Telehealth: Payer: Self-pay

## 2024-02-25 ENCOUNTER — Institutional Professional Consult (permissible substitution) (INDEPENDENT_AMBULATORY_CARE_PROVIDER_SITE_OTHER): Payer: Medicaid Other | Admitting: Otolaryngology

## 2024-02-25 NOTE — Telephone Encounter (Signed)
 Hydroxyzine has been discontinued, and I recommend not taking it with Alprazolam. Please request a refill for Alprazolam on 03/16/2024, as the last refill was on 02/17/2024.

## 2024-02-25 NOTE — Telephone Encounter (Signed)
 Copied from CRM [id number]. Topic: Clinical - Prescription Issue >> Feb 25, 2024  3:17 PM Whitney O wrote: Reason for CRM: patient is calling cause she said the doctor was suppose to send 20 of the ALPRAZolam (XANAX) 0.5 MG tablet and she said the doctor said not to take the hydrOXYzine (VISTARIL) 25 MG capsule and the xanax together but they refilled the hydrOXYzine (VISTARIL) 25 MG capsule. Patient needs clarification of the medication she received whoch is the hydrOXYzine (VISTARIL) 25 MG capsule and was told not to take them . Please give patient a call for clarification please . But I did advise to listen to doctor orders  [phone]

## 2024-03-03 NOTE — Telephone Encounter (Signed)
 Unable to leave a message.

## 2024-03-09 NOTE — Progress Notes (Deleted)
 Denise Ford, female    DOB: 06-24-1961    MRN: [id number]   Brief patient profile:  30  yowf  active smoker  referred to pulmonary clinic in [place]  12/10/2023 by Gilmore Laroche NP  for cough and doe  x fall 2023.   Hoarse x her whole life with last Wolicki eval 03/2022 pos polyps     History of Present Illness  12/10/2023  Pulmonary/ 1st office eval/ Denise Ford / [place] Office on prn saba  Chief Complaint  Patient presents with   Establish Care   Shortness of Breath   Dyspnea:  50 ft downhill to [place] and out of breath when gets  to  house  Cough: white in am / 30 min  Sleep: flat bed one pillow/ once   month  SABA use: avg twice a day / after ex  02: none  Lung cancer screen: per PCP Rec My office will be contacting you by phone for referral to [facility]  and [facility]FTs [facility] - if you don't hear back from my office within one week please call us back or notify us thru MyChart and we'll address it right away.  Plan A = Automatic = Always=   Symbicort 80 Take 2 puffs first thing in am and then another 2 puffs about 12 hours later.   Work on inhaler technique:   >>>  Remember how golfers warm up by taking practice swings - do this with an empty inhaler  Plan B = Backup (to supplement plan A, not to replace it) Only use your albuterol inhaler as a rescue medication  The key is to stop smoking completely before smoking completely stops you!   Please schedule a follow up office visit in 6 weeks, call sooner if needed - bring inhalers   03/10/2024  f/u ov/[place] office/Denise Ford re: *** maint on ***  No chief complaint on file.   Dyspnea:  *** Cough: *** Sleeping: ***   resp cc  SABA use: *** 02: ***  Lung cancer screening: ***   No obvious day to day or daytime variability or assoc excess/ purulent sputum or mucus plugs or hemoptysis or cp or chest tightness, subjective wheeze or overt sinus or hb symptoms.    Also denies any obvious fluctuation of symptoms with weather or  environmental changes or other aggravating or alleviating factors except as outlined above   No unusual exposure hx or h/o childhood pna/ asthma or knowledge of premature birth.  Current Allergies, Complete Past Medical History, Past Surgical History, Family History, and Social History were reviewed in [employer] record.  ROS  The following are not active complaints unless bolded Hoarseness, sore throat, dysphagia, dental problems, itching, sneezing,  nasal congestion or discharge of excess mucus or purulent secretions, ear ache,   fever, chills, sweats, unintended wt loss or wt gain, classically pleuritic or exertional cp,  orthopnea pnd or arm/hand swelling  or leg swelling, presyncope, palpitations, abdominal pain, anorexia, nausea, vomiting, diarrhea  or change in bowel habits or change in bladder habits, change in stools or change in urine, dysuria, hematuria,  rash, arthralgias, visual complaints, headache, numbness, weakness or ataxia or problems with walking or coordination,  change in mood or  memory.        No outpatient medications have been marked as taking for the 03/10/24 encounter (Appointment) with Nyoka Cowden, MD.            Past Medical History:  Diagnosis Date  Anxiety    Hyperlipidemia    Vocal cord polyps       Objective:    wts   03/10/2024        ***  02/22/24 144 lb 1.3 oz (65.4 kg)  12/10/23 148 lb (67.1 kg)  10/22/23 158 lb 1.9 oz (71.7 kg)      Vital signs reviewed  03/10/2024  - Note at rest 02 sats  ***% on ***   General appearance:    ***    Min bar ***          Assessment

## 2024-03-10 ENCOUNTER — Ambulatory Visit (HOSPITAL_COMMUNITY)
Admission: RE | Admit: 2024-03-10 | Discharge: 2024-03-10 | Disposition: A | Discharge status: Home / Self Care | Payer: Medicaid Other | Source: Ambulatory Visit | Attending: Internal Medicine | Admitting: Internal Medicine

## 2024-03-10 ENCOUNTER — Ambulatory Visit: Payer: Medicaid Other | Admitting: Internal Medicine

## 2024-03-10 DIAGNOSIS — J449 Chronic obstructive pulmonary disease, unspecified: Secondary | ICD-10-CM | POA: Diagnosis present

## 2024-03-10 LAB — PULMONARY FUNCTION TEST
DL/VA % pred: 86 %
DL/VA: 3.66 ml/min/mmHg/L
DLCO unc % pred: 80 %
DLCO unc: 16.1 ml/min/mmHg
FEF 25-75 Post: 0.82 L/s
FEF 25-75 Pre: 0.89 L/s
FEF2575-%Change-Post: -7 %
FEF2575-%Pred-Post: 35 %
FEF2575-%Pred-Pre: 38 %
FEV1-%Change-Post: -5 %
FEV1-%Pred-Post: 49 %
FEV1-%Pred-Pre: 52 %
FEV1-Post: 1.24 L
FEV1-Pre: 1.32 L
FEV1FVC-%Change-Post: -12 %
FEV1FVC-%Pred-Pre: 79 %
FEV6-%Change-Post: 6 %
FEV6-%Pred-Post: 70 %
FEV6-%Pred-Pre: 66 %
FEV6-Post: 2.22 L
FEV6-Pre: 2.09 L
FEV6FVC-%Change-Post: 0 %
FEV6FVC-%Pred-Post: 101 %
FEV6FVC-%Pred-Pre: 102 %
FVC-%Change-Post: 7 %
FVC-%Pred-Post: 69 %
FVC-%Pred-Pre: 64 %
FVC-Post: 2.28 L
FVC-Pre: 2.12 L
Post FEV1/FVC ratio: 55 %
Post FEV6/FVC ratio: 98 %
Pre FEV1/FVC ratio: 62 %
Pre FEV6/FVC Ratio: 98 %
RV % pred: 147 %
RV: 2.97 L
TLC % pred: 103 %
TLC: 5.22 L

## 2024-03-10 MED ORDER — ALBUTEROL SULFATE (2.5 MG/3ML) 0.083% IN NEBU
2.5000 mg | INHALATION_SOLUTION | Freq: Once | RESPIRATORY_TRACT | Status: AC
  Administered 2024-03-10: 2.5 mg via RESPIRATORY_TRACT

## 2024-03-14 ENCOUNTER — Other Ambulatory Visit: Payer: Self-pay | Admitting: Family Medicine

## 2024-03-14 ENCOUNTER — Telehealth: Payer: Self-pay

## 2024-03-14 DIAGNOSIS — F411 Generalized anxiety disorder: Secondary | ICD-10-CM

## 2024-03-14 DIAGNOSIS — K2100 Gastro-esophageal reflux disease with esophagitis, without bleeding: Secondary | ICD-10-CM

## 2024-03-14 NOTE — Telephone Encounter (Signed)
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

## 2024-03-14 NOTE — Telephone Encounter (Signed)
 I left a message for the patient to return my call.

## 2024-04-02 NOTE — Progress Notes (Deleted)
 Denise Ford, female    DOB: 09/17/1961    MRN: [id number]   Brief patient profile:  70  yowf  active smoker  referred to pulmonary clinic in [place]  12/10/2023 by Gilmore Laroche NP  for cough and doe  x fall 2023.   Hoarse x her whole life with last Wolicki eval 03/2022 pos polyps     History of Present Illness  12/10/2023  Pulmonary/ 1st office eval/ Ailed Defibaugh / [place] [facility] on prn saba  Chief Complaint  Patient presents with   Establish Care   Shortness of Breath   Dyspnea:  50 ft downhill to [place] and out of breath when gets  to  house  Cough: white in am / 30 min  Sleep: flat bed one pillow/ once   month  SABA use: avg twice a day / after ex  02: none  Lung cancer screen: per PCP Rec My office will be contacting you by phone for referral to [facility]  and [facility] - if you don't hear back from my office within one week please call us back or notify us thru MyChart and we'll address it right away.  Plan A = Automatic = Always=   Symbicort 80 Take 2 puffs first thing in am and then another 2 puffs about 12 hours later.   Work on inhaler technique:   >>>  Remember how golfers warm up by taking practice swings - do this with an empty inhaler  Plan B = Backup (to supplement plan A, not to replace it) Only use your albuterol inhaler as a rescue medication  The key is to stop smoking completely before smoking completely stops you!   Please schedule a follow up office visit in 6 weeks, call sooner if needed - bring inhalers   04/04/2024  f/u ov/ office/Neev Mcmains re: *** maint on ***  No chief complaint on file.   Dyspnea:  *** Cough: *** Sleeping: ***   resp cc  SABA use: *** 02: ***  Lung cancer screening: ***   No obvious day to day or daytime variability or assoc excess/ purulent sputum or mucus plugs or hemoptysis or cp or chest tightness, subjective wheeze or overt sinus or hb symptoms.    Also denies any obvious fluctuation of symptoms with weather or  environmental changes or other aggravating or alleviating factors except as outlined above   No unusual exposure hx or h/o childhood pna/ asthma or knowledge of premature birth.  Current Allergies, Complete Past Medical History, Past Surgical History, Family History, and Social History were reviewed in [employer] record.  ROS  The following are not active complaints unless bolded Hoarseness, sore throat, dysphagia, dental problems, itching, sneezing,  nasal congestion or discharge of excess mucus or purulent secretions, ear ache,   fever, chills, sweats, unintended wt loss or wt gain, classically pleuritic or exertional cp,  orthopnea pnd or arm/hand swelling  or leg swelling, presyncope, palpitations, abdominal pain, anorexia, nausea, vomiting, diarrhea  or change in bowel habits or change in bladder habits, change in stools or change in urine, dysuria, hematuria,  rash, arthralgias, visual complaints, headache, numbness, weakness or ataxia or problems with walking or coordination,  change in mood or  memory.        No outpatient medications have been marked as taking for the 04/04/24 encounter (Appointment) with Nyoka Cowden, MD.            Past Medical History:  Diagnosis Date  Anxiety    Hyperlipidemia    Vocal cord polyps       Objective:    wts   04/04/2024        ***  02/22/24 144 lb 1.3 oz (65.4 kg)  12/10/23 148 lb (67.1 kg)  10/22/23 158 lb 1.9 oz (71.7 kg)      Vital signs reviewed  04/04/2024  - Note at rest 02 sats  ***% on ***   General appearance:    ***    Min bar ***          Assessment

## 2024-04-04 ENCOUNTER — Encounter: Payer: Self-pay | Admitting: Internal Medicine

## 2024-04-04 ENCOUNTER — Ambulatory Visit: Admitting: Internal Medicine

## 2024-04-20 ENCOUNTER — Institutional Professional Consult (permissible substitution) (INDEPENDENT_AMBULATORY_CARE_PROVIDER_SITE_OTHER): Payer: Medicaid Other | Admitting: Otolaryngology

## 2024-04-30 ENCOUNTER — Other Ambulatory Visit: Payer: Self-pay | Admitting: Family Medicine

## 2024-04-30 DIAGNOSIS — F411 Generalized anxiety disorder: Secondary | ICD-10-CM

## 2024-05-02 ENCOUNTER — Other Ambulatory Visit: Payer: Self-pay | Admitting: Family Medicine

## 2024-05-05 ENCOUNTER — Other Ambulatory Visit: Payer: Self-pay | Admitting: Family Medicine

## 2024-05-05 DIAGNOSIS — K2100 Gastro-esophageal reflux disease with esophagitis, without bleeding: Secondary | ICD-10-CM

## 2024-05-15 NOTE — Progress Notes (Deleted)
 Denise Ford, female    DOB: [birth year]    MRN: [id number]   Brief patient profile:  18  yowf  active smoker  referred to pulmonary clinic in [place]  12/10/2023 by Gloria Zarwolo NP  for cough and doe  x fall 2023.   Hoarse x her whole life with last [facility] eval 03/2022 pos polyps     History of Present Illness  12/10/2023  Pulmonary/ 1st office eval/ Carlota Philley / [place] [facility] on prn saba  Chief Complaint  Patient presents with   Establish Care   Shortness of Breath   Dyspnea:  50 ft downhill to mb and out of breath when gets  to  house  Cough: white in am / 30 min  Sleep: flat bed one pillow/ once   month  SABA use: avg twice a day / after ex  02: none  Lung cancer screen: per PCP Rec My office will be contacting you by phone for referral to [facility]> net seen as of 05/16/2024  Plan A = Automatic = Always=   Symbicort  80 Take 2 puffs first thing in am and then another 2 puffs about 12 hours later.  Work on inhaler technique:   >>>  Remember how golfers warm up by taking practice swings - do this with an empty inhaler  Plan B = Backup (to supplement plan A, not to replace it) Only use your albuterol  inhaler as a rescue medication The key is to stop smoking completely before smoking completely stops you!   Please schedule a follow up office visit in 6 weeks, call sooner if needed - bring inhalers  - PFT's  03/10/24  FEV1 1.32 (52 % ) ratio 0.62  p 0 % improvement from saba p symbicort   prior to study with DLCO  16 (80%)   and FV curve atypical exp> insp  and ERV 18 at wt 144       05/16/2024  f/u ov/[place] office/Syniah Berne re: *** maint on *** did *** bring inhalers  No chief complaint on file.   Dyspnea:  *** Cough: *** Sleeping: ***   resp cc  SABA use: *** 02: ***  Lung cancer screening: ***   No obvious day to day or daytime variability or assoc excess/ purulent sputum or mucus plugs or hemoptysis or cp or chest tightness, subjective wheeze or overt sinus or hb  symptoms.    Also denies any obvious fluctuation of symptoms with weather or environmental changes or other aggravating or alleviating factors except as outlined above   No unusual exposure hx or h/o childhood pna/ asthma or knowledge of premature birth.  Current Allergies, Complete Past Medical History, Past Surgical History, Family History, and Social History were reviewed in [employer] record.  ROS  The following are not active complaints unless bolded Hoarseness, sore throat, dysphagia, dental problems, itching, sneezing,  nasal congestion or discharge of excess mucus or purulent secretions, ear ache,   fever, chills, sweats, unintended wt loss or wt gain, classically pleuritic or exertional cp,  orthopnea pnd or arm/hand swelling  or leg swelling, presyncope, palpitations, abdominal pain, anorexia, nausea, vomiting, diarrhea  or change in bowel habits or change in bladder habits, change in stools or change in urine, dysuria, hematuria,  rash, arthralgias, visual complaints, headache, numbness, weakness or ataxia or problems with walking or coordination,  change in mood or  memory.        No outpatient medications have been marked as taking for  the 05/16/24 encounter (Appointment) with Cleta Heatley B, MD.             Past Medical History:  Diagnosis Date   Anxiety    Hyperlipidemia    Vocal cord polyps       Objective:    Wts  05/16/2024        ***   02/22/24 144 lb 1.3 oz (65.4 kg)  12/10/23 148 lb (67.1 kg)  10/22/23 158 lb 1.9 oz (71.7 kg)      Vital signs reviewed  05/16/2024  - Note at rest 02 sats  ***% on ***   General appearance:    ***       Min bar***       Assessment

## 2024-05-16 ENCOUNTER — Ambulatory Visit: Admitting: Internal Medicine

## 2024-05-23 ENCOUNTER — Other Ambulatory Visit: Payer: Self-pay | Admitting: Family Medicine

## 2024-06-02 ENCOUNTER — Other Ambulatory Visit: Payer: Self-pay | Admitting: Family Medicine

## 2024-06-02 DIAGNOSIS — F411 Generalized anxiety disorder: Secondary | ICD-10-CM

## 2024-06-03 ENCOUNTER — Other Ambulatory Visit: Payer: Self-pay | Admitting: Family Medicine

## 2024-06-09 DIAGNOSIS — R7301 Impaired fasting glucose: Secondary | ICD-10-CM | POA: Diagnosis not present

## 2024-06-09 DIAGNOSIS — E038 Other specified hypothyroidism: Secondary | ICD-10-CM | POA: Diagnosis not present

## 2024-06-09 DIAGNOSIS — E559 Vitamin D deficiency, unspecified: Secondary | ICD-10-CM | POA: Diagnosis not present

## 2024-06-09 DIAGNOSIS — E7849 Other hyperlipidemia: Secondary | ICD-10-CM | POA: Diagnosis not present

## 2024-06-11 LAB — CBC WITH DIFFERENTIAL/PLATELET
Basophils Absolute: 0.1 10*3/uL (ref 0.0–0.2)
Basos: 0 %
EOS (ABSOLUTE): 0.1 10*3/uL (ref 0.0–0.4)
Eos: 1 %
Hematocrit: 44.6 % (ref 34.0–46.6)
Hemoglobin: 14.6 g/dL (ref 11.1–15.9)
Immature Grans (Abs): 0 10*3/uL (ref 0.0–0.1)
Immature Granulocytes: 0 %
Lymphocytes Absolute: 3.5 10*3/uL — ABNORMAL HIGH (ref 0.7–3.1)
Lymphs: 31 %
MCH: 35.7 pg — ABNORMAL HIGH (ref 26.6–33.0)
MCHC: 32.7 g/dL (ref 31.5–35.7)
MCV: 109 fL — ABNORMAL HIGH (ref 79–97)
Monocytes Absolute: 0.7 10*3/uL (ref 0.1–0.9)
Monocytes: 6 %
Neutrophils Absolute: 6.9 10*3/uL (ref 1.4–7.0)
Neutrophils: 62 %
Platelets: 252 10*3/uL (ref 150–450)
RBC: 4.09 x10E6/uL (ref 3.77–5.28)
RDW: 12 % (ref 11.7–15.4)
WBC: 11.2 10*3/uL — ABNORMAL HIGH (ref 3.4–10.8)

## 2024-06-11 LAB — CMP14+EGFR
ALT: 14 IU/L (ref 0–32)
AST: 17 IU/L (ref 0–40)
Albumin: 4.3 g/dL (ref 3.9–4.9)
Alkaline Phosphatase: 69 IU/L (ref 44–121)
BUN/Creatinine Ratio: 24 (ref 12–28)
BUN: 17 mg/dL (ref 8–27)
Bilirubin Total: 0.2 mg/dL (ref 0.0–1.2)
CO2: 23 mmol/L (ref 20–29)
Calcium: 10.3 mg/dL (ref 8.7–10.3)
Chloride: 104 mmol/L (ref 96–106)
Creatinine, Ser: 0.72 mg/dL (ref 0.57–1.00)
Globulin, Total: 2.2 g/dL (ref 1.5–4.5)
Glucose: 109 mg/dL — ABNORMAL HIGH (ref 70–99)
Potassium: 4.4 mmol/L (ref 3.5–5.2)
Sodium: 143 mmol/L (ref 134–144)
Total Protein: 6.5 g/dL (ref 6.0–8.5)
eGFR: 95 mL/min/{1.73_m2} (ref >59)

## 2024-06-11 LAB — HEMOGLOBIN A1C
Est. average glucose Bld gHb Est-mCnc: 103 mg/dL
Hgb A1c MFr Bld: 5.2 % (ref 4.8–5.6)

## 2024-06-11 LAB — TSH+FREE T4
Free T4: 0.89 ng/dL (ref 0.82–1.77)
TSH: 2.72 u[IU]/mL (ref 0.450–4.500)

## 2024-06-11 LAB — LIPID PANEL
Chol/HDL Ratio: 2.3 ratio (ref 0.0–4.4)
Cholesterol, Total: 206 mg/dL — ABNORMAL HIGH (ref 100–199)
HDL: 91 mg/dL (ref >39)
LDL Chol Calc (NIH): 98 mg/dL (ref 0–99)
Triglycerides: 95 mg/dL (ref 0–149)
VLDL Cholesterol Cal: 17 mg/dL (ref 5–40)

## 2024-06-11 LAB — VITAMIN D 25 HYDROXY (VIT D DEFICIENCY, FRACTURES): Vit D, 25-Hydroxy: 67 ng/mL (ref 30.0–100.0)

## 2024-06-13 ENCOUNTER — Ambulatory Visit: Payer: Self-pay | Admitting: Family Medicine

## 2024-06-23 ENCOUNTER — Ambulatory Visit (INDEPENDENT_AMBULATORY_CARE_PROVIDER_SITE_OTHER): Payer: Medicaid Other | Admitting: Family Medicine

## 2024-06-23 ENCOUNTER — Encounter: Payer: Self-pay | Admitting: Family Medicine

## 2024-06-23 VITALS — BP 131/79 | HR 71 | Resp 16 | Ht 64.0 in | Wt 129.8 lb

## 2024-06-23 DIAGNOSIS — F411 Generalized anxiety disorder: Secondary | ICD-10-CM

## 2024-06-23 DIAGNOSIS — N939 Abnormal uterine and vaginal bleeding, unspecified: Secondary | ICD-10-CM

## 2024-06-23 DIAGNOSIS — Z113 Encounter for screening for infections with a predominantly sexual mode of transmission: Secondary | ICD-10-CM

## 2024-06-23 DIAGNOSIS — Z1231 Encounter for screening mammogram for malignant neoplasm of breast: Secondary | ICD-10-CM

## 2024-06-23 MED ORDER — ALPRAZOLAM 0.5 MG PO TABS: 0.5000 mg | ORAL_TABLET | Freq: Every evening | ORAL | 0 refills | Status: DC | PRN

## 2024-06-23 NOTE — Patient Instructions (Addendum)
 I appreciate the opportunity to provide care to you today!    Follow up:  6 months  Vaginal Atrophy -Please stop by [facility] for a pelvic ultrasound to evaluate endometrial thickness and rule out structural abnormalities such as polyps, fibroids, or endometrial hyperplasia.  -Encourage the use of vaginal moisturizers and lubricants to reduce discomfort during intercourse.  -Avoid douching and the use of vaginal irritants to prevent further irritation or dryness.   Please follow up if your symptoms worsen or fail to improve.    Please continue to a heart-healthy diet and increase your physical activities. Try to exercise for at least five days a week.    It was a pleasure to see you and I look forward to continuing to work together on your health and well-being. Please do not hesitate to call the office if you need care or have questions about your care.  In case of emergency, please visit the Emergency Department for urgent care, or contact our clinic at [phone] to schedule an appointment. We're here to help you!   Have a wonderful day and week. With Gratitude, Cassandr Cederberg MSN, FNP-BC

## 2024-06-23 NOTE — Assessment & Plan Note (Addendum)
 The patient is encouraged to stop by [facility] for a pelvic ultrasound to evaluate endometrial thickness and rule out structural abnormalities such as polyps, fibroids, or endometrial hyperplasia. Recommend the use of vaginal moisturizers and lubricants to reduce discomfort during intercourse. Advise avoiding douching and the use of vaginal irritants to prevent further irritation and dryness.

## 2024-06-23 NOTE — Progress Notes (Signed)
 Established Patient Office Visit  Subjective:  Patient ID: Denise Ford, female    DOB: 1961/08/28  Age: 63 y.o. MRN: [id number]  CC:  Chief Complaint  Patient presents with   Follow-up   Exposure to STD    No symptoms but she did have uncprotected sex and wants to be checked for STD's to be sure     HPI Denise Ford is a 63 y.o. female presents for f/u of chronic medical conditions.  vulvovaginal atrophy: The patient presents with concerns related to vulvovaginal atrophy. She reports recently engaging in sexual intercourse, which resulted in increased pain and postcoital bleeding. She expresses concern about the possibility of endometrial cancer, noting that her ex-husband had a history of cancer. She denies associated symptoms such as abdominal pain, bloating, or unintentional weight loss.  Past Medical History:  Diagnosis Date   Anxiety    Hyperlipidemia    Vocal cord polyps     Past Surgical History:  Procedure Laterality Date   CESAREAN SECTION     x2   COLONOSCOPY WITH PROPOFOL  N/A 07/18/2022   Procedure: COLONOSCOPY WITH PROPOFOL ;  Surgeon: Shaaron Lamar HERO, MD;  [facility];  Service: Endoscopy;  Laterality: N/A;  12:45pm   ESOPHAGOGASTRODUODENOSCOPY (EGD) WITH PROPOFOL  N/A 03/24/2022   Procedure: ESOPHAGOGASTRODUODENOSCOPY (EGD) WITH PROPOFOL ;  Surgeon: Shaaron Lamar HERO, MD;  [facility];  Service: Endoscopy;  Laterality: N/A;  1:30pm   MALONEY DILATION N/A 03/24/2022   Procedure: AGAPITO DILATION;  Surgeon: Shaaron Lamar HERO, MD;  [facility]: [facility];  Service: Endoscopy;  Laterality: N/A;   POLYPECTOMY  07/18/2022   Procedure: POLYPECTOMY;  Surgeon: Shaaron Lamar HERO, MD;  [facility]: [facility];  Service: Endoscopy;;   TUBAL LIGATION      Family History  Problem Relation Age of Onset   Hypertension Mother    COPD Mother    Cancer Mother        thoat cancer   Hypertension Father    Cancer Paternal Aunt        pancreatic cancer    Cancer Paternal Uncle    Colon cancer Neg Hx     Social History   Socioeconomic History   Marital status: Married    Spouse name: Not on file   Number of children: Not on file   Years of education: Not on file   Highest education level: Not on file  Occupational History   Not on file  Tobacco Use   Smoking status: Every Day    Current packs/day: 0.25    Average packs/day: 0.3 packs/day for 30.0 years (7.5 ttl pk-yrs)    Types: Cigarettes    Passive exposure: Current   Smokeless tobacco: Never  Vaping Use   Vaping status: Never Used  Substance and Sexual Activity   Alcohol use: Not Currently    Alcohol/week: 10.0 standard drinks of alcohol    Types: 10 Cans of beer per week    Comment: prior heavy etoh use/ seldom   Drug use: Yes    Frequency: 2.0 times per week    Types: Marijuana    Comment: occ; last used 03/23/22   Sexual activity: Not Currently  Other Topics Concern   Not on file  Social History Narrative   Not on file   Social Drivers of Health   Financial Resource Strain: Not on file  Food Insecurity: Not on file  Transportation Needs: Not on file  Physical Activity: Not on file  Stress: Not on file  Social Connections: Not on file  Intimate Partner Violence: Not on file    Outpatient Medications Prior to Visit  Medication Sig Dispense Refill   atorvastatin  (LIPITOR) 20 MG tablet Take 1 tablet by mouth once daily 90 tablet 0   Budeson-Glycopyrrol-Formoterol  (BREZTRI  AEROSPHERE) 160-9-4.8 MCG/ACT AERO Inhale 2 puffs into the lungs in the morning and at bedtime.     budesonide -formoterol  (SYMBICORT ) 80-4.5 MCG/ACT inhaler Take 2 puffs first thing in am and then another 2 puffs about 12 hours later. 1 each 12   cyanocobalamin (VITAMIN B12) 1000 MCG tablet Take 1 tablet (1,000 mcg total) by mouth daily. 90 tablet 0   Omega-3 Fatty Acids (FISH OIL PO) Take 1 tablet by mouth daily.     omeprazole  (PRILOSEC) 40 MG capsule TAKE 1 CAPSULE BY MOUTH IN THE MORNING  AND AT BEDTIME 180 capsule 0   VENTOLIN  HFA 108 (90 Base) MCG/ACT inhaler INHALE 2 PUFFS BY MOUTH EVERY 6 HOURS AS NEEDED FOR WHEEZING FOR SHORTNESS OF BREATH FOR COUGH 18 g 0   Vitamin D , Ergocalciferol , (DRISDOL ) 1.25 MG (50000 UNIT) CAPS capsule Take 1 capsule (50,000 Units total) by mouth every 7 (seven) days. 20 capsule 1   ALPRAZolam  (XANAX ) 0.5 MG tablet TAKE 1 TABLET BY MOUTH AS NEEDED FOR ANXIETY 20 tablet 0   sertraline  (ZOLOFT ) 50 MG tablet Take 1 tablet (50 mg total) by mouth daily. (Patient not taking: Reported on 06/23/2024) 60 tablet 1   No facility-administered medications prior to visit.    No Known Allergies  ROS Review of Systems  Constitutional:  Negative for chills and fever.  Eyes:  Negative for visual disturbance.  Respiratory:  Negative for chest tightness and shortness of breath.   Neurological:  Negative for dizziness and headaches.      Objective:    Physical Exam HENT:     Head: Normocephalic.     Mouth/Throat:     Mouth: Mucous membranes are moist.   Cardiovascular:     Rate and Rhythm: Normal rate.     Heart sounds: Normal heart sounds.  Pulmonary:     Effort: Pulmonary effort is normal.     Breath sounds: Normal breath sounds.   Neurological:     Mental Status: She is alert.     BP 131/79   Pulse 71   Resp 16   Ht 5' 4 (1.626 m)   Wt 129 lb 12.8 oz (58.9 kg)   SpO2 94%   BMI 22.28 kg/m  Wt Readings from Last 3 Encounters:  06/23/24 129 lb 12.8 oz (58.9 kg)  02/22/24 144 lb 1.3 oz (65.4 kg)  12/10/23 148 lb (67.1 kg)    Lab Results  Component Value Date   TSH 2.720 06/09/2024   Lab Results  Component Value Date   WBC 11.2 (H) 06/09/2024   HGB 14.6 06/09/2024   HCT 44.6 06/09/2024   MCV 109 (H) 06/09/2024   PLT 252 06/09/2024   Lab Results  Component Value Date   NA 143 06/09/2024   K 4.4 06/09/2024   CO2 23 06/09/2024   GLUCOSE 109 (H) 06/09/2024   BUN 17 06/09/2024   CREATININE 0.72 06/09/2024   BILITOT 0.2  06/09/2024   ALKPHOS 69 06/09/2024   AST 17 06/09/2024   ALT 14 06/09/2024   PROT 6.5 06/09/2024   ALBUMIN 4.3 06/09/2024   CALCIUM  10.3 06/09/2024   ANIONGAP 6 11/28/2022   EGFR 95 06/09/2024   Lab Results  Component Value Date   CHOL 206 (H) 06/09/2024   Lab Results  Component Value Date   HDL 91 06/09/2024   Lab Results  Component Value Date   LDLCALC 98 06/09/2024   Lab Results  Component Value Date   TRIG 95 06/09/2024   Lab Results  Component Value Date   CHOLHDL 2.3 06/09/2024   Lab Results  Component Value Date   HGBA1C 5.2 06/09/2024      Assessment & Plan:  Vaginal bleeding Assessment & Plan: The patient is encouraged to stop by [facility] for a pelvic ultrasound to evaluate endometrial thickness and rule out structural abnormalities such as polyps, fibroids, or endometrial hyperplasia. Recommend the use of vaginal moisturizers and lubricants to reduce discomfort during intercourse. Advise avoiding douching and the use of vaginal irritants to prevent further irritation and dryness.     Orders: -     US  PELVIC COMPLETE WITH TRANSVAGINAL  Screening for STD (sexually transmitted disease) -     NuSwab Vaginitis Plus (VG+)  Visit for screening mammogram -     3D Screening Mammogram, Left and Right  GAD (generalized anxiety disorder) -     ALPRAZolam ; Take 1 tablet (0.5 mg total) by mouth at bedtime as needed for anxiety.  Dispense: 20 tablet; Refill: 0   Note: This chart has been completed using Engineer, civil (consulting) software, and while attempts have been made to ensure accuracy, certain words and phrases may not be transcribed as intended.   Follow-up: Return in about 6 months (around 12/23/2024).   Shawnika Pepin, FNP

## 2024-06-26 ENCOUNTER — Other Ambulatory Visit: Payer: Self-pay | Admitting: Family Medicine

## 2024-06-26 ENCOUNTER — Ambulatory Visit: Payer: Self-pay | Admitting: Family Medicine

## 2024-06-26 DIAGNOSIS — N760 Acute vaginitis: Secondary | ICD-10-CM

## 2024-06-26 MED ORDER — METRONIDAZOLE 500 MG PO TABS: 500.0000 mg | ORAL_TABLET | Freq: Two times a day (BID) | ORAL | 0 refills | Status: AC

## 2024-06-26 NOTE — Progress Notes (Signed)
 Please inform the patient that a prescription for Flagyl, to be taken twice daily for seven days, has been sent to her pharmacy. This is based on STD screening results indicating she is positive for bacterial vaginosis. Please advise her to avoid alcohol during the treatment regimen

## 2024-06-27 ENCOUNTER — Telehealth: Payer: Self-pay

## 2024-06-27 LAB — NUSWAB VAGINITIS PLUS (VG+)
Atopobium vaginae: HIGH {score} — AB
BVAB 2: HIGH {score} — AB
Candida albicans, NAA: NEGATIVE
Candida glabrata, NAA: NEGATIVE
Megasphaera 1: HIGH {score} — AB

## 2024-06-27 NOTE — Telephone Encounter (Signed)
 Spoke to pt and was advised with verbal understanding

## 2024-06-27 NOTE — Telephone Encounter (Signed)
 Copied from CRM [id number]. Topic: Clinical - Lab/Test Results >> Jun 27, 2024  9:13 AM Treva T wrote: Reason for CRM: Received call form patient, stating she received results from a vaginal swab and has additional questions regarding the results.  Please contact patient back to discuss further at [phone].

## 2024-07-04 ENCOUNTER — Ambulatory Visit (HOSPITAL_COMMUNITY)
Admission: RE | Admit: 2024-07-04 | Discharge: 2024-07-04 | Disposition: A | Discharge status: Home / Self Care | Source: Ambulatory Visit | Attending: Family Medicine | Admitting: Family Medicine

## 2024-07-04 DIAGNOSIS — N939 Abnormal uterine and vaginal bleeding, unspecified: Secondary | ICD-10-CM | POA: Diagnosis not present

## 2024-07-04 NOTE — Progress Notes (Signed)
 Please inform the patient that her pelvic ultrasound was normal with no endometrial thickening visualized

## 2024-07-13 NOTE — Progress Notes (Unsigned)
 Denise Ford, female    DOB: Jan 29, 1961    MRN: [id number]   Brief patient profile:  38  yowf  active smoker  referred to pulmonary clinic in [place]  12/10/2023 by Gloria Zarwolo NP  for cough and doe x fall 2023.  Hoarse x her whole life with last [facility] eval 03/2022 pos polyps    History of Present Illness  12/10/2023  Pulmonary/ 1st office eval/ [place] Wolfert / [place] [facility] on prn saba  Chief Complaint  Patient presents with   Establish Care   Shortness of Breath   Dyspnea:  50 ft downhill to mb and out of breath when gets  to  house  Cough: white in am / 30 min  Sleep: flat bed one pillow/ once   month  SABA use: avg twice a day / after ex  02: none  Lung cancer screen: per PCP Rec My office will be contacting you by phone for referral to [facility] > not seen as of 07/14/2024  - if you don't hear back from my office within one week please call us  back or notify us  thru MyChart and we'll address it right away.  Plan A = Automatic = Always=   Symbicort  80 Take 2 puffs first thing in am and then another 2 puffs about 12 hours later.  Work on inhaler technique:  >>>  Remember how golfers warm up by taking practice swings - do this with an empty inhaler  Plan B = Backup (to supplement plan A, not to replace it) Only use your albuterol  inhaler as a rescue medication The key is to stop smoking completely before smoking completely stops you!    PFT's 03/10/24  FEV1 1.32 (52 % ) ratio 0.62 p 0 % improvement from saba p symbicort  prior to study with DLCO 16 (80%) and FV curve atypical exp> insp straightening but no plateau and ERV 18 at wt 144   07/14/2024  f/u ov/[place] office/Timon[facility] [place] re: GOLD 2 copd  maint on symbicort  80  did  bring albuterol  inhalers and still smoking  Chief Complaint  Patient presents with   Follow-up    Breathing is some better since the last visit. She would like referral to ENT in  regarding vocal cord polyps. She is using her albuterol  inhaler  about 5 x per wk on average.    Dyspnea:  mb to landing and stops to catch  breathbut easier on symbicort  despit suboptimal hfa (see below)  Cough: minimal white mucus smokers rattle  Sleeping: flat bed one pillow s    resp cc  SABA use: rarely hfa  02: none   Lung cancer screening:scheduled  for 07/2824    No obvious day to day or daytime variability or assoc excess/ purulent sputum or mucus plugs or hemoptysis or cp or chest tightness, subjective wheeze or overt sinus or hb symptoms.    Also denies any obvious fluctuation of symptoms with weather or environmental changes or other aggravating or alleviating factors except as outlined above   No unusual exposure hx or h/o childhood pna/ asthma or knowledge of premature birth.  Current Allergies, Complete Past Medical History, Past Surgical History, Family History, and Social History were reviewed in [employer] record.  ROS  The following are not active complaints unless bolded Hoarseness, sore throat, dysphagia, dental problems, itching, sneezing,  nasal congestion or discharge of excess mucus or purulent secretions, ear ache,   fever, chills, sweats, unintended wt loss or wt  gain, classically pleuritic or exertional cp,  orthopnea pnd or arm/hand swelling  or leg swelling, presyncope, palpitations, abdominal pain, anorexia, nausea, vomiting, diarrhea  or change in bowel habits or change in bladder habits, change in stools or change in urine, dysuria, hematuria,  rash, arthralgias, visual complaints, headache, numbness, weakness or ataxia or problems with walking or coordination,  change in mood or  memory.        Current Meds  Medication Sig   ALPRAZolam  (XANAX ) 0.5 MG tablet Take 1 tablet (0.5 mg total) by mouth at bedtime as needed for anxiety.   atorvastatin  (LIPITOR) 20 MG tablet Take 1 tablet by mouth once daily   budesonide -formoterol  (SYMBICORT ) 80-4.5 MCG/ACT inhaler Take 2 puffs first thing in am and then  another 2 puffs about 12 hours later.   cyanocobalamin (VITAMIN B12) 1000 MCG tablet Take 1 tablet (1,000 mcg total) by mouth daily.   Omega-3 Fatty Acids (FISH OIL PO) Take 1 tablet by mouth daily.   omeprazole  (PRILOSEC) 40 MG capsule TAKE 1 CAPSULE BY MOUTH IN THE MORNING AND AT BEDTIME   VENTOLIN  HFA 108 (90 Base) MCG/ACT inhaler INHALE 2 PUFFS BY MOUTH EVERY 6 HOURS AS NEEDED FOR WHEEZING FOR SHORTNESS OF BREATH FOR COUGH   Vitamin D , Ergocalciferol , (DRISDOL ) 1.25 MG (50000 UNIT) CAPS capsule Take 1 capsule (50,000 Units total) by mouth every 7 (seven) days.           Past Medical History:  Diagnosis Date   Anxiety    Hyperlipidemia    Vocal cord polyps       Objective:    wts    07/14/2024       129   06/23/24 129 lb 12.8 oz (58.9 kg)  02/22/24 144 lb 1.3 oz (65.4 kg)  12/10/23 148 lb (67.1 kg)      Vital signs reviewed  07/14/2024  - Note at rest 02 sats  98% on RA   General appearance:    amb hoarse wf nad       HEENT : Oropharynx  clear   Nasal turbinates nl    NECK :  without  apparent JVD/ palpable Nodes/TM    LUNGS: no acc muscle use,  Min barrel  contour chest wall with bilateral scattered insp/exp rhonchi  and  without cough on insp or exp maneuvers and min  Hyperresonant  to  percussion bilaterally    CV:  RRR  no s3 or murmur or increase in P2, and no edema   ABD:  soft and nontender with pos end  insp Hoover's  in the supine position.  No bruits or organomegaly appreciated   MS:  Nl gait/ ext warm without deformities Or obvious joint restrictions  calf tenderness, cyanosis or clubbing     SKIN: warm and dry without lesions    NEURO:  alert, approp, nl sensorium with  no motor or cerebellar deficits apparent.             Assessment

## 2024-07-14 ENCOUNTER — Ambulatory Visit (INDEPENDENT_AMBULATORY_CARE_PROVIDER_SITE_OTHER): Admitting: Internal Medicine

## 2024-07-14 ENCOUNTER — Encounter: Payer: Self-pay | Admitting: Internal Medicine

## 2024-07-14 VITALS — BP 86/58 | HR 71 | Ht 64.0 in | Wt 129.4 lb

## 2024-07-14 DIAGNOSIS — J449 Chronic obstructive pulmonary disease, unspecified: Secondary | ICD-10-CM

## 2024-07-14 DIAGNOSIS — R490 Dysphonia: Secondary | ICD-10-CM | POA: Diagnosis not present

## 2024-07-14 DIAGNOSIS — F17210 Nicotine dependence, cigarettes, uncomplicated: Secondary | ICD-10-CM

## 2024-07-14 NOTE — Patient Instructions (Signed)
 No change in medications   Work on inhaler technique:  relax and gently blow all the way out then take a nice smooth full deep breath back in, triggering the inhaler at same time you start breathing in.  Hold breath in for at least  5 seconds if you can. Blow out symbicort   thru nose. Rinse and gargle with water when done.  If mouth or throat bother you at all,  try brushing teeth/gums/tongue with arm and hammer toothpaste/ make a slurry and gargle and spit out.   >>>  Remember how golfers warm up by taking practice swings - do this with an empty inhaler   The key is to stop smoking completely before smoking completely stops you!  Reschedule with Dr Soldatova ([facility])     Please schedule a follow up visit in 6  months but call sooner if needed

## 2024-07-15 NOTE — Assessment & Plan Note (Signed)
 Referred to [facility] (had seen [facility] last in 03/2022 with dx polyps )  so referred to [facility]  12/10/2023 >>> did not go so referred again 07/14/2024   - avoiding max inhaler rx for now if favor of low dose ICS and hopefully cigartette elimination  Discussed in detail all the  indications, usual  risks and alternatives  relative to the benefits with patient who agrees to proceed with w/u as outlined.            Each maintenance medication was reviewed in detail including emphasizing most importantly the difference between maintenance and prns and under what circumstances the prns are to be triggered using an action plan format where appropriate.  Total time for H and P, chart review, counseling, reviewing hfa  device(s) and generating customized AVS unique to this office visit / same day charting = 32 min

## 2024-07-15 NOTE — Assessment & Plan Note (Signed)
 4-5 min discussion re active cigarette smoking in addition to office E&M  Ask about tobacco use:   ongoing  Advise quitting   benefits with smoker's rattle  and hoarsenss along with reduction of risk of progressive airflow obstruction and risk of Ca of throat and lung reviewed  Assess willingness:  Not committed at this point Assist in quit attempt:  Per PCP when ready Arrange follow up:   Follow up per Primary Care planned

## 2024-07-16 ENCOUNTER — Encounter: Payer: Self-pay | Admitting: Internal Medicine

## 2024-07-18 ENCOUNTER — Other Ambulatory Visit: Payer: Self-pay | Admitting: Family Medicine

## 2024-07-18 DIAGNOSIS — F17200 Nicotine dependence, unspecified, uncomplicated: Secondary | ICD-10-CM

## 2024-07-18 MED ORDER — NICOTINE 21 MG/24HR TD PT24: 21.0000 mg | MEDICATED_PATCH | Freq: Every day | TRANSDERMAL | 0 refills | Status: DC

## 2024-07-27 ENCOUNTER — Other Ambulatory Visit: Payer: Self-pay | Admitting: Family Medicine

## 2024-07-27 DIAGNOSIS — F411 Generalized anxiety disorder: Secondary | ICD-10-CM

## 2024-08-25 ENCOUNTER — Ambulatory Visit (HOSPITAL_COMMUNITY)
Admission: RE | Admit: 2024-08-25 | Discharge: 2024-08-25 | Disposition: A | Discharge status: Home / Self Care | Source: Ambulatory Visit | Attending: Acute Care | Admitting: Acute Care

## 2024-08-25 DIAGNOSIS — F17210 Nicotine dependence, cigarettes, uncomplicated: Secondary | ICD-10-CM | POA: Diagnosis not present

## 2024-08-25 DIAGNOSIS — Z87891 Personal history of nicotine dependence: Secondary | ICD-10-CM | POA: Insufficient documentation

## 2024-08-25 DIAGNOSIS — Z122 Encounter for screening for malignant neoplasm of respiratory organs: Secondary | ICD-10-CM | POA: Diagnosis not present

## 2024-08-26 ENCOUNTER — Other Ambulatory Visit: Payer: Self-pay | Admitting: Family Medicine

## 2024-08-26 DIAGNOSIS — F411 Generalized anxiety disorder: Secondary | ICD-10-CM

## 2024-08-31 ENCOUNTER — Telehealth: Payer: Self-pay

## 2024-08-31 NOTE — Telephone Encounter (Signed)
 Copied from CRM #[id number]. Topic: Clinical - Lab/Test Results >> Aug 31, 2024 11:25 AM Delon DASEN wrote: Reason for CRM: patient calling for results of lung scan- [phone], nothing noted in chart

## 2024-09-06 ENCOUNTER — Telehealth: Payer: Self-pay

## 2024-09-06 ENCOUNTER — Other Ambulatory Visit: Payer: Self-pay | Admitting: Acute Care

## 2024-09-06 DIAGNOSIS — Z122 Encounter for screening for malignant neoplasm of respiratory organs: Secondary | ICD-10-CM

## 2024-09-06 DIAGNOSIS — F17210 Nicotine dependence, cigarettes, uncomplicated: Secondary | ICD-10-CM

## 2024-09-06 DIAGNOSIS — Z87891 Personal history of nicotine dependence: Secondary | ICD-10-CM

## 2024-09-06 NOTE — Telephone Encounter (Signed)
 Copied from CRM [id number]. Topic: Clinical - Medication Question >> Sep 06, 2024  2:42 PM Sophia H wrote: Reason for CRM: Patient states she received a message regarding a medication and is wanting to know where it came from. Says it is a medication called ATO? Please advise.  Patient also wants to know if her results came in from her lung screening. Please reach out. # [phone]

## 2024-09-07 ENCOUNTER — Other Ambulatory Visit: Payer: Self-pay | Admitting: Family Medicine

## 2024-09-07 DIAGNOSIS — E785 Hyperlipidemia, unspecified: Secondary | ICD-10-CM

## 2024-09-09 ENCOUNTER — Ambulatory Visit (HOSPITAL_COMMUNITY)

## 2024-09-16 ENCOUNTER — Ambulatory Visit (HOSPITAL_COMMUNITY)
Admission: RE | Admit: 2024-09-16 | Discharge: 2024-09-16 | Disposition: A | Discharge status: Home / Self Care | Source: Ambulatory Visit | Attending: Family Medicine | Admitting: Family Medicine

## 2024-09-16 ENCOUNTER — Encounter (HOSPITAL_COMMUNITY): Payer: Self-pay

## 2024-09-16 DIAGNOSIS — Z1231 Encounter for screening mammogram for malignant neoplasm of breast: Secondary | ICD-10-CM | POA: Diagnosis not present

## 2024-09-26 ENCOUNTER — Other Ambulatory Visit: Payer: Self-pay | Admitting: Family Medicine

## 2024-09-26 DIAGNOSIS — F411 Generalized anxiety disorder: Secondary | ICD-10-CM

## 2024-10-02 ENCOUNTER — Other Ambulatory Visit: Payer: Self-pay | Admitting: Family Medicine

## 2024-10-18 ENCOUNTER — Ambulatory Visit: Payer: Medicaid Other | Admitting: "Endocrinology

## 2024-10-27 ENCOUNTER — Other Ambulatory Visit: Payer: Self-pay | Admitting: Family Medicine

## 2024-10-27 DIAGNOSIS — F411 Generalized anxiety disorder: Secondary | ICD-10-CM

## 2024-11-15 ENCOUNTER — Other Ambulatory Visit: Payer: Self-pay | Admitting: *Deleted

## 2024-11-15 DIAGNOSIS — E049 Nontoxic goiter, unspecified: Secondary | ICD-10-CM

## 2024-11-15 DIAGNOSIS — F17200 Nicotine dependence, unspecified, uncomplicated: Secondary | ICD-10-CM

## 2024-11-25 ENCOUNTER — Other Ambulatory Visit: Payer: Self-pay

## 2024-11-25 DIAGNOSIS — F411 Generalized anxiety disorder: Secondary | ICD-10-CM

## 2024-11-28 ENCOUNTER — Ambulatory Visit: Admitting: "Endocrinology

## 2024-11-30 DIAGNOSIS — E049 Nontoxic goiter, unspecified: Secondary | ICD-10-CM | POA: Diagnosis not present

## 2024-11-30 DIAGNOSIS — F17200 Nicotine dependence, unspecified, uncomplicated: Secondary | ICD-10-CM | POA: Diagnosis not present

## 2024-12-01 LAB — T4, FREE: Free T4: 1 ng/dL (ref 0.82–1.77)

## 2024-12-01 LAB — TSH: TSH: 3.2 u[IU]/mL (ref 0.450–4.500)

## 2024-12-01 LAB — T3, FREE: T3, Free: 2.4 pg/mL (ref 2.0–4.4)

## 2024-12-06 ENCOUNTER — Other Ambulatory Visit: Payer: Self-pay | Admitting: Family Medicine

## 2024-12-06 ENCOUNTER — Ambulatory Visit: Admitting: "Endocrinology

## 2024-12-06 ENCOUNTER — Encounter: Payer: Self-pay | Admitting: "Endocrinology

## 2024-12-06 VITALS — BP 108/70 | HR 76 | Ht 64.0 in | Wt 128.0 lb

## 2024-12-06 DIAGNOSIS — E785 Hyperlipidemia, unspecified: Secondary | ICD-10-CM

## 2024-12-06 DIAGNOSIS — E049 Nontoxic goiter, unspecified: Secondary | ICD-10-CM

## 2024-12-06 NOTE — Progress Notes (Signed)
 12/06/2024, 3:40 PM  Endocrinology follow-up note   Subjective:    Patient ID: Denise Ford, female    DOB: [birth year], PCP Bacchus, Meade PEDLAR, FNP   Past Medical History:  Diagnosis Date   Anxiety    Hyperlipidemia    Vocal cord polyps    Past Surgical History:  Procedure Laterality Date   CESAREAN SECTION     x2   COLONOSCOPY WITH PROPOFOL  N/A 07/18/2022   Procedure: COLONOSCOPY WITH PROPOFOL ;  Surgeon: Shaaron Lamar HERO, MD;  [facility];  Service: Endoscopy;  Laterality: N/A;  12:45pm   ESOPHAGOGASTRODUODENOSCOPY (EGD) WITH PROPOFOL  N/A 03/24/2022   Procedure: ESOPHAGOGASTRODUODENOSCOPY (EGD) WITH PROPOFOL ;  Surgeon: Shaaron Lamar HERO, MD;  [facility];  Service: Endoscopy;  Laterality: N/A;  1:30pm   MALONEY DILATION N/A 03/24/2022   Procedure: AGAPITO DILATION;  Surgeon: Shaaron Lamar HERO, MD;  [facility];  Service: Endoscopy;  Laterality: N/A;   POLYPECTOMY  07/18/2022   Procedure: POLYPECTOMY;  Surgeon: Shaaron Lamar HERO, MD;  [facility];  Service: Endoscopy;;   TUBAL LIGATION     Social History   Socioeconomic History   Marital status: Widowed    Spouse name: Not on file   Number of children: Not on file   Years of education: Not on file   Highest education level: Not on file  Occupational History   Not on file  Tobacco Use   Smoking status: Every Day    Current packs/day: 0.25    Average packs/day: 0.3 packs/day for 30.0 years (7.5 ttl pk-yrs)    Types: Cigarettes    Passive exposure: Current   Smokeless tobacco: Never  Vaping Use   Vaping status: Never Used  Substance and Sexual Activity   Alcohol use: Not Currently    Alcohol/week: 10.0 standard drinks of alcohol    Types: 10 Cans of beer per week    Comment: prior heavy etoh use/ seldom   Drug use: Yes    Frequency: 2.0 times per week    Types: Marijuana    Comment: occ; last used 03/23/22   Sexual  activity: Not Currently  Other Topics Concern   Not on file  Social History Narrative   Not on file   Social Drivers of Health   Financial Resource Strain: Not on file  Food Insecurity: Not on file  Transportation Needs: Not on file  Physical Activity: Not on file  Stress: Not on file  Social Connections: Not on file   Family History  Problem Relation Age of Onset   Hypertension Mother    COPD Mother    Cancer Mother        thoat cancer   Hypertension Father    Cancer Paternal Aunt        pancreatic cancer   Cancer Paternal Uncle    Colon cancer Neg Hx    Outpatient Encounter Medications as of 12/06/2024  Medication Sig   ALPRAZolam  (XANAX ) 0.5 MG tablet TAKE 1 TABLET BY MOUTH AT BEDTIME AS NEEDED FOR ANXIETY   atorvastatin  (LIPITOR) 20 MG tablet Take 1 tablet by mouth once daily   budesonide -formoterol  (SYMBICORT ) 80-4.5 MCG/ACT inhaler Take 2 puffs first thing in  am and then another 2 puffs about 12 hours later.   cyanocobalamin (VITAMIN B12) 1000 MCG tablet Take 1 tablet (1,000 mcg total) by mouth daily.   nicotine  (NICODERM CQ ) 21 mg/24hr patch Place 1 patch (21 mg total) onto the skin daily.   Omega-3 Fatty Acids (FISH OIL PO) Take 1 tablet by mouth daily.   omeprazole  (PRILOSEC) 40 MG capsule TAKE 1 CAPSULE BY MOUTH IN THE MORNING AND AT BEDTIME   VENTOLIN  HFA 108 (90 Base) MCG/ACT inhaler INHALE 2 PUFFS BY MOUTH EVERY 6 HOURS AS NEEDED FOR COUGHING, WHEEZING, OR SHORTNESS OF BREATH   Vitamin D , Ergocalciferol , (DRISDOL ) 1.25 MG (50000 UNIT) CAPS capsule Take 1 capsule (50,000 Units total) by mouth every 7 (seven) days.   No facility-administered encounter medications on file as of 12/06/2024.   ALLERGIES: No Known Allergies  VACCINATION STATUS: Immunization History  Administered Date(s) Administered   Influenza, Seasonal, Injecte, Preservative Fre 10/22/2023   Moderna Sars-Covid-2 Vaccination 05/04/2020, 06/01/2020, 11/11/2021   PNEUMOCOCCAL CONJUGATE-20  02/22/2024   Tdap 03/09/2023   Zoster Recombinant(Shingrix ) 03/09/2023, 06/01/2023    HPI Denise Ford is 63 y.o. female who presents today to review her biopsy results after she was seen in consultation for nodular goiter last visit.   PCP:  Edman Meade PEDLAR, FNP.   She is known to have nodular goiter since 2016.  She is not on any thyroid  hormone supplements or antithyroid medications.  She has a previous fine-needle aspiration with benign findings.  Her previsit thyroid  function test continue to be consistent with euthyroid presentation.  She presents with significant weight loss, reportedly intentional.  She has no new complaints today.  She continues to smoke. she is a chronic heavy smoker, reports some voice change recently, continues to be have voice hoarseness.   She denies dysphagia, shortness of breath.  She is currently following up with pulmonology. -She denies weight loss, palpitations, heat/cold intolerance.  She has some unidentified thyroid  dysfunction in one of her grandparents.     Review of Systems  Limited as above.  Objective:       12/06/2024    2:47 PM 07/14/2024    1:52 PM 06/23/2024   11:23 AM  Vitals with BMI  Height 5' 4 5' 4 5' 4  Weight 128 lbs 129 lbs 6 oz 129 lbs 13 oz  BMI 21.96 22.2 22.27  Systolic 108 86 131  Diastolic 70 58 79  Pulse 76 71 71    BP 108/70   Pulse 76   Ht 5' 4 (1.626 m)   Wt 128 lb (58.1 kg)   BMI 21.97 kg/m   Wt Readings from Last 3 Encounters:  12/06/24 128 lb (58.1 kg)  07/14/24 129 lb 6.4 oz (58.7 kg)  06/23/24 129 lb 12.8 oz (58.9 kg)    Physical Exam  Constitutional:  Body mass index is 21.97 kg/m.,  not in acute distress, normal state of mind Eyes: PERRLA, EOMI, no exophthalmos ENT: moist mucous membranes, + gross thyromegaly, no gross cervical lymphadenopathy, + voice hoarseness unchanged   CMP ( most recent) CMP     Component Value Date/Time   NA 143 06/09/2024 0827   K 4.4 06/09/2024 0827    CL 104 06/09/2024 0827   CO2 23 06/09/2024 0827   GLUCOSE 109 (H) 06/09/2024 0827   GLUCOSE 86 11/28/2022 1416   BUN 17 06/09/2024 0827   CREATININE 0.72 06/09/2024 0827   CALCIUM  10.3 06/09/2024 0827   PROT 6.5 06/09/2024 0827  ALBUMIN 4.3 06/09/2024 0827   AST 17 06/09/2024 0827   ALT 14 06/09/2024 0827   ALKPHOS 69 06/09/2024 0827   BILITOT 0.2 06/09/2024 0827   GFRNONAA >60 11/28/2022 1416   GFRAA >60 08/22/2020 0949     Diabetic Labs (most recent): Lab Results  Component Value Date   HGBA1C 5.2 06/09/2024   HGBA1C 5.7 (H) 07/15/2023   HGBA1C 5.4 03/16/2023     Lipid Panel ( most recent) Lipid Panel     Component Value Date/Time   CHOL 206 (H) 06/09/2024 0827   TRIG 95 06/09/2024 0827   HDL 91 06/09/2024 0827   CHOLHDL 2.3 06/09/2024 0827   CHOLHDL 2.7 11/28/2022 1416   VLDL 31 11/28/2022 1416   LDLCALC 98 06/09/2024 0827   LABVLDL 17 06/09/2024 0827      Lab Results  Component Value Date   TSH 3.200 11/30/2024   TSH 2.720 06/09/2024   TSH 1.870 10/16/2023   TSH 2.100 08/03/2023   TSH 5.890 (H) 07/15/2023   TSH 2.510 03/16/2023   TSH 1.908 10/14/2022   TSH 2.239 10/22/2021   TSH 1.836 08/22/2020   FREET4 1.00 11/30/2024   FREET4 0.89 06/09/2024   FREET4 1.23 10/16/2023   FREET4 1.24 08/03/2023   FREET4 0.94 07/15/2023   FREET4 1.44 03/16/2023   FREET4 0.91 10/14/2022   FREET4 0.69 10/22/2021   FREET4 0.77 08/22/2020     Thyroid  ultrasound on August 14, 2020: Right lobe measured 5.4 cm with 2.6 cm nodule which increased in size from 2.2 cm in 2016 Left lobe measured 4.3 cm with no discrete nodules.   Fine-needle aspiration of right lobe nodule on October 10, 2020 FINAL MICROSCOPIC DIAGNOSIS:  - Consistent with benign follicular nodule (Bethesda category II)  SPECIMEN ADEQUACY:  Satisfactory for evaluation    Assessment & Plan:   1. Nodular goiter-benign FNA 2. Current smoker -Her fine-needle aspiration biopsy has been negative for  malignancy.  She continues to have euthyroid nodular goiter.  Physical exam today is unremarkable.   -She will not need surgery or antithyroid intervention at this time.  She will return in 1 year with repeat thyroid  function test and thyroid  ultrasound.    The patient was counseled on the dangers of tobacco use, and was advised to quit.  Reviewed strategies to maximize success, including removing cigarettes and smoking materials from environment.   - she is advised to maintain close follow up with Bacchus, Meade PEDLAR, FNP for primary care needs.   I spent  22  minutes in the care of the patient today including review of labs from Thyroid  Function, CMP, and other relevant labs ; imaging/biopsy records (current and previous including abstractions from other facilities); face-to-face time discussing  her lab results and symptoms, medications doses, her options of short and long term treatment based on the latest standards of care / guidelines;   and documenting the encounter.  Denise Ford  participated in the discussions, expressed understanding, and voiced agreement with the above plans.  All questions were answered to her satisfaction. she is encouraged to contact clinic should she have any questions or concerns prior to her return visit.    Follow up plan: Return in about 1 year (around 12/06/2025) for F/U with Pre-visit Labs.   Ranny Earl, MD [facility] [address] [place], [place] [address] Phone: [phone]  Fax: [phone]     12/06/2024, 3:40 PM  This note was partially dictated with voice  recognition software. Similar sounding words can be transcribed inadequately or may not  be corrected upon review.

## 2024-12-07 ENCOUNTER — Telehealth: Payer: Self-pay | Admitting: Family Medicine

## 2024-12-07 NOTE — Telephone Encounter (Signed)
 Spoke with Denise Ford she is going to keep her virtual appt and call us  if she needs help logging on. I stated to her I did not see labs requested as of today

## 2024-12-07 NOTE — Telephone Encounter (Signed)
 Copied from CRM #[id number]. Topic: Appointments - Scheduling Inquiry for Clinic >> Dec 07, 2024  3:20 PM Emylou G wrote: Reason for CRM: Pls call patient.. can we do phone appt versus video.. needs to know if she needs bloodwork?

## 2024-12-12 ENCOUNTER — Other Ambulatory Visit: Payer: Self-pay | Admitting: Family Medicine

## 2024-12-20 ENCOUNTER — Telehealth: Payer: Self-pay

## 2024-12-20 NOTE — Telephone Encounter (Signed)
 Copied from CRM [id number]. Topic: Appointments - Scheduling Inquiry for Clinic >> Dec 20, 2024  8:55 AM Charlet HERO wrote: Reason for CRM: Patient calling to reschdule appt for 12/26 which is video call she wants in [place] and in person was not able to make appt for patient got error message no schedule. Left the appt for 01/26 please call the patient back for further asst with scheduling

## 2024-12-20 NOTE — Telephone Encounter (Signed)
Rescheduled in office visit.

## 2024-12-23 ENCOUNTER — Ambulatory Visit: Admitting: Family Medicine

## 2024-12-26 ENCOUNTER — Ambulatory Visit: Admitting: Family Medicine

## 2024-12-26 ENCOUNTER — Other Ambulatory Visit: Payer: Self-pay

## 2024-12-26 DIAGNOSIS — F411 Generalized anxiety disorder: Secondary | ICD-10-CM

## 2024-12-28 ENCOUNTER — Telehealth: Payer: Self-pay

## 2024-12-28 ENCOUNTER — Other Ambulatory Visit: Payer: Self-pay

## 2024-12-28 DIAGNOSIS — F411 Generalized anxiety disorder: Secondary | ICD-10-CM

## 2024-12-28 MED ORDER — ALPRAZOLAM 0.5 MG PO TABS: 0.5000 mg | ORAL_TABLET | Freq: Every evening | ORAL | 0 refills | Status: DC | PRN

## 2024-12-28 NOTE — Telephone Encounter (Signed)
"  Patient advised  "

## 2024-12-28 NOTE — Telephone Encounter (Signed)
 Copied from CRM #[id number]. Topic: Clinical - Prescription Issue >> Dec 28, 2024 10:55 AM Antwanette L wrote: Reason for CRM: The patient is calling because she received a notification from [facility] stating that her refill for ALPRAZolam  (Xanax ) 0.5 mg tablets was not approved. According to the chart, Leita Longs, FNP, submitted the order today.

## 2024-12-28 NOTE — Telephone Encounter (Signed)
 Copied from CRM #[id number]. Topic: Clinical - Medication Question >> Dec 28, 2024  9:09 AM Myrick T wrote: Reason for CRM: patient called stated she is going out of town this afternoon and will need her medication ALPRAZolam  (XANAX ) 0.5 MG tablet before she leave. Please f/u with patient

## 2024-12-28 NOTE — Telephone Encounter (Signed)
"  Confirmed by pharmacy   "

## 2024-12-28 NOTE — Telephone Encounter (Signed)
 Xanax  sent to [facility] pharmacy

## 2024-12-30 ENCOUNTER — Other Ambulatory Visit: Payer: Self-pay | Admitting: Internal Medicine

## 2024-12-30 DIAGNOSIS — J449 Chronic obstructive pulmonary disease, unspecified: Secondary | ICD-10-CM

## 2025-01-04 ENCOUNTER — Encounter: Payer: Self-pay | Admitting: Nurse Practitioner

## 2025-01-04 ENCOUNTER — Ambulatory Visit: Payer: Self-pay | Admitting: Nurse Practitioner

## 2025-01-04 VITALS — BP 122/78 | HR 86 | Ht 64.0 in | Wt 128.0 lb

## 2025-01-04 DIAGNOSIS — K2100 Gastro-esophageal reflux disease with esophagitis, without bleeding: Secondary | ICD-10-CM | POA: Diagnosis not present

## 2025-01-04 DIAGNOSIS — E559 Vitamin D deficiency, unspecified: Secondary | ICD-10-CM | POA: Diagnosis not present

## 2025-01-04 DIAGNOSIS — F411 Generalized anxiety disorder: Secondary | ICD-10-CM

## 2025-01-04 DIAGNOSIS — J449 Chronic obstructive pulmonary disease, unspecified: Secondary | ICD-10-CM | POA: Diagnosis not present

## 2025-01-04 DIAGNOSIS — Z6821 Body mass index (BMI) 21.0-21.9, adult: Secondary | ICD-10-CM

## 2025-01-04 DIAGNOSIS — F17210 Nicotine dependence, cigarettes, uncomplicated: Secondary | ICD-10-CM

## 2025-01-04 DIAGNOSIS — E049 Nontoxic goiter, unspecified: Secondary | ICD-10-CM | POA: Diagnosis not present

## 2025-01-04 DIAGNOSIS — E785 Hyperlipidemia, unspecified: Secondary | ICD-10-CM

## 2025-01-04 DIAGNOSIS — Z23 Encounter for immunization: Secondary | ICD-10-CM

## 2025-01-04 MED ORDER — ALPRAZOLAM 0.5 MG PO TABS: 0.5000 mg | ORAL_TABLET | Freq: Every evening | ORAL | 1 refills | Status: AC | PRN

## 2025-01-04 MED ORDER — ATORVASTATIN CALCIUM 20 MG PO TABS: 20.0000 mg | ORAL_TABLET | Freq: Every day | ORAL | 1 refills | Status: AC

## 2025-01-04 MED ORDER — OMEPRAZOLE 40 MG PO CPDR: 40.0000 mg | DELAYED_RELEASE_CAPSULE | Freq: Every day | ORAL | 1 refills | Status: AC

## 2025-01-04 MED ORDER — NICOTINE 14 MG/24HR TD PT24: 14.0000 mg | MEDICATED_PATCH | Freq: Every day | TRANSDERMAL | 2 refills | Status: AC

## 2025-01-04 MED ORDER — SYMBICORT 80-4.5 MCG/ACT IN AERO: INHALATION_SPRAY | RESPIRATORY_TRACT | 5 refills | Status: AC

## 2025-01-04 MED ORDER — VITAMIN D (ERGOCALCIFEROL) 1.25 MG (50000 UNIT) PO CAPS: 50000.0000 [IU] | ORAL_CAPSULE | ORAL | 1 refills | Status: AC

## 2025-01-04 NOTE — Progress Notes (Signed)
 "  Subjective:    Patient ID: Denise Ford, female    DOB: 12/11/1961, 64 y.o.   MRN: [id number]   Chief Complaint: medical management of chronic issues     HPI:  Denise Ford is a 64 y.o. who identifies as a female who was assigned female at birth.   Social history: Lives with: by herself Work history: retired   Water Engineer in today for follow up of the following chronic medical issues:  1. Hyperlipidemia, unspecified hyperlipidemia type Does try to watch diet. No regular exercise. Is onlipitor daily with no c/o myalgia. Lab Results  Component Value Date   CHOL 206 (H) 06/09/2024   HDL 91 06/09/2024   LDLCALC 98 06/09/2024   TRIG 95 06/09/2024   CHOLHDL 2.3 06/09/2024   The 10-year ASCVD risk score (Arnett DK, et al., 2019) is: 5.6%   2. 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 Has not needed albuterol  much. Sees Dr. Darlean yearly.  3. Cigarette smoker Smokes about 1/2 pack a day. Had chest CT in August 2025 and was clear. Is on nicoderm patches 21mg - wants to go down to 14mg . Has really helped  4. Gastroesophageal reflux disease with esophagitis without hemorrhage Is on omeprazole  but is working well for her.  5. Nodular goiter Last thyroid  U/S was unchanged. Sees Dr. Nida every 6 months.  6. GAD (generalized anxiety disorder) Xanax  as needed. Takes about 4x a week.    01/04/2025   10:00 AM 06/23/2024   11:25 AM 10/22/2023   11:08 AM 07/13/2023   10:56 AM  GAD 7 : Generalized Anxiety Score  Nervous, Anxious, on Edge 0 1 1 1   Control/stop worrying 0 0 0 0  Worry too much - different things 0 0 0 0  Trouble relaxing 0 0 0 0  Restless 0 0 0 0  Easily annoyed or irritable 0 1 0 0  Afraid - awful might happen 0 0 1 1  Total GAD 7 Score 0 2 2 2   Anxiety Difficulty Not difficult at all Not difficult at all Somewhat difficult Not difficult at all      7. Vitamin D  deficiency Is on weekly vitamin d  supplement Last vitamin D  Lab  Results  Component Value Date   VD25OH 67.0 06/09/2024      8. BMI 21.0-21.9, adult No recent weight changes Wt Readings from Last 3 Encounters:  01/04/25 128 lb (58.1 kg)  12/06/24 128 lb (58.1 kg)  07/14/24 129 lb 6.4 oz (58.7 kg)   BMI Readings from Last 3 Encounters:  01/04/25 21.97 kg/m  12/06/24 21.97 kg/m  07/14/24 22.21 kg/m    Has history of elevated hgba1c- watches carbs in diet- last hgba1c was 5.2%  New complaints: None today  Allergies[1] Outpatient Encounter Medications as of 01/04/2025  Medication Sig   ALPRAZolam  (XANAX ) 0.5 MG tablet Take 1 tablet (0.5 mg total) by mouth at bedtime as needed for sleep.   atorvastatin  (LIPITOR) 20 MG tablet Take 1 tablet by mouth once daily   cyanocobalamin (VITAMIN B12) 1000 MCG tablet Take 1 tablet (1,000 mcg total) by mouth daily.   nicotine  (NICODERM CQ ) 21 mg/24hr patch Place 1 patch (21 mg total) onto the skin daily.   Omega-3 Fatty Acids (FISH OIL PO) Take 1 tablet by mouth daily.   omeprazole  (PRILOSEC) 40 MG capsule TAKE 1 CAPSULE BY MOUTH IN THE MORNING AND AT BEDTIME   SYMBICORT  80-4.5 MCG/ACT inhaler INHALE 2 PUFFS  BY MOUTH FIRST THING IN THE MORNING AND THEN ANOTHER 2 PUFFS ABOUT 12 HOURS LATER   VENTOLIN  HFA 108 (90 Base) MCG/ACT inhaler INHALE 2 PUFFS BY MOUTH EVERY 6 HOURS AS NEEDED FOR  COUGHING,  WHEEZING,  AND  SHORTNESS  OF  BREATH   Vitamin D , Ergocalciferol , (DRISDOL ) 1.25 MG (50000 UNIT) CAPS capsule Take 1 capsule (50,000 Units total) by mouth every 7 (seven) days.   No facility-administered encounter medications on file as of 01/04/2025.    Past Surgical History:  Procedure Laterality Date   CESAREAN SECTION     x2   COLONOSCOPY WITH PROPOFOL  N/A 07/18/2022   Procedure: COLONOSCOPY WITH PROPOFOL ;  Surgeon: Shaaron Lamar HERO, MD;  [facility];  Service: Endoscopy;  Laterality: N/A;  12:45pm   ESOPHAGOGASTRODUODENOSCOPY (EGD) WITH PROPOFOL  N/A 03/24/2022   Procedure:  ESOPHAGOGASTRODUODENOSCOPY (EGD) WITH PROPOFOL ;  Surgeon: Shaaron Lamar HERO, MD;  [facility];  Service: Endoscopy;  Laterality: N/A;  1:30pm   MALONEY DILATION N/A 03/24/2022   Procedure: AGAPITO DILATION;  Surgeon: Shaaron Lamar HERO, MD;  [facility];  Service: Endoscopy;  Laterality: N/A;   POLYPECTOMY  07/18/2022   Procedure: POLYPECTOMY;  Surgeon: Shaaron Lamar HERO, MD;  [facility];  Service: Endoscopy;;   TUBAL LIGATION      Family History  Problem Relation Age of Onset   Hypertension Mother    COPD Mother    Cancer Mother        thoat cancer   Hypertension Father    Cancer Paternal Aunt        pancreatic cancer   Cancer Paternal Uncle    Colon cancer Neg Hx       Controlled substance contract: n/a     Review of Systems  Constitutional:  Negative for diaphoresis.  Eyes:  Negative for pain.  Respiratory:  Positive for cough. Negative for shortness of breath.   Cardiovascular:  Negative for chest pain, palpitations and leg swelling.  Gastrointestinal:  Negative for abdominal pain.  Endocrine: Negative for polydipsia.  Skin:  Negative for rash.  Neurological:  Negative for dizziness, weakness and headaches.  Hematological:  Does not bruise/bleed easily.  All other systems reviewed and are negative.      Objective:   Physical Exam Vitals and nursing note reviewed.  Constitutional:      General: She is not in acute distress.    Appearance: Normal appearance. She is well-developed.  HENT:     Head: Normocephalic.     Right Ear: Tympanic membrane normal.     Left Ear: Tympanic membrane normal.     Nose: Nose normal.     Mouth/Throat:     Mouth: Mucous membranes are moist.  Eyes:     Pupils: Pupils are equal, round, and reactive to light.  Neck:     Vascular: No carotid bruit or JVD.  Cardiovascular:     Rate and Rhythm: Normal rate and regular rhythm.     Heart sounds: Normal heart sounds.     Comments: Occasional  PAC's Pulmonary:     Effort: Pulmonary effort is normal. No respiratory distress.     Breath sounds: Normal breath sounds. No wheezing or rales.  Chest:     Chest wall: No tenderness.  Abdominal:     General: Bowel sounds are normal. There is no distension or abdominal bruit.     Palpations: Abdomen is soft. There is no hepatomegaly, splenomegaly, mass or pulsatile mass.  Tenderness: There is no abdominal tenderness.  Musculoskeletal:        General: Normal range of motion.     Cervical back: Normal range of motion and neck supple.  Lymphadenopathy:     Cervical: No cervical adenopathy.  Skin:    General: Skin is warm and dry.  Neurological:     Mental Status: She is alert and oriented to person, place, and time.     Deep Tendon Reflexes: Reflexes are normal and symmetric.  Psychiatric:        Behavior: Behavior normal.        Thought Content: Thought content normal.        Judgment: Judgment normal.     BP 122/78   Pulse 86   Ht 5' 4 (1.626 m)   Wt 128 lb (58.1 kg)   SpO2 98%   BMI 21.97 kg/m        Assessment & Plan:       [1] No Known Allergies  "

## 2025-01-04 NOTE — Patient Instructions (Signed)

## 2025-01-04 NOTE — Addendum Note (Signed)
 Addended by: DYANE MOATS D on: 01/04/2025 10:37 AM   Modules accepted: Orders

## 2025-01-04 NOTE — Addendum Note (Signed)
 Addended by: GLADIS MUSTARD on: 01/04/2025 10:30 AM   Modules accepted: Level of Service

## 2025-01-05 ENCOUNTER — Ambulatory Visit: Payer: Self-pay | Admitting: Nurse Practitioner

## 2025-01-05 LAB — CBC WITH DIFFERENTIAL/PLATELET
Basophils Absolute: 0.1 x10E3/uL (ref 0.0–0.2)
Basos: 1 %
EOS (ABSOLUTE): 0.1 x10E3/uL (ref 0.0–0.4)
Eos: 1 %
Hematocrit: 39.7 % (ref 34.0–46.6)
Hemoglobin: 13.4 g/dL (ref 11.1–15.9)
Immature Grans (Abs): 0 x10E3/uL (ref 0.0–0.1)
Immature Granulocytes: 0 %
Lymphocytes Absolute: 3.1 x10E3/uL (ref 0.7–3.1)
Lymphs: 38 %
MCH: 36.5 pg — ABNORMAL HIGH (ref 26.6–33.0)
MCHC: 33.8 g/dL (ref 31.5–35.7)
MCV: 108 fL — ABNORMAL HIGH (ref 79–97)
Monocytes Absolute: 0.7 x10E3/uL (ref 0.1–0.9)
Monocytes: 9 %
Neutrophils Absolute: 4.1 x10E3/uL (ref 1.4–7.0)
Neutrophils: 51 %
Platelets: 248 x10E3/uL (ref 150–450)
RBC: 3.67 x10E6/uL — ABNORMAL LOW (ref 3.77–5.28)
RDW: 11.9 % (ref 11.7–15.4)
WBC: 8 x10E3/uL (ref 3.4–10.8)

## 2025-01-05 LAB — CMP14+EGFR
ALT: 16 IU/L (ref 0–32)
AST: 25 IU/L (ref 0–40)
Albumin: 4 g/dL (ref 3.9–4.9)
Alkaline Phosphatase: 48 IU/L — ABNORMAL LOW (ref 49–135)
BUN/Creatinine Ratio: 16 (ref 12–28)
BUN: 13 mg/dL (ref 8–27)
Bilirubin Total: 0.5 mg/dL (ref 0.0–1.2)
CO2: 23 mmol/L (ref 20–29)
Calcium: 9.7 mg/dL (ref 8.7–10.3)
Chloride: 101 mmol/L (ref 96–106)
Creatinine, Ser: 0.8 mg/dL (ref 0.57–1.00)
Globulin, Total: 2 g/dL (ref 1.5–4.5)
Glucose: 100 mg/dL — ABNORMAL HIGH (ref 70–99)
Potassium: 4.3 mmol/L (ref 3.5–5.2)
Sodium: 138 mmol/L (ref 134–144)
Total Protein: 6 g/dL (ref 6.0–8.5)
eGFR: 83 mL/min/1.73

## 2025-01-05 LAB — LIPID PANEL
Chol/HDL Ratio: 1.8 ratio (ref 0.0–4.4)
Cholesterol, Total: 215 mg/dL — ABNORMAL HIGH (ref 100–199)
HDL: 119 mg/dL
LDL Chol Calc (NIH): 84 mg/dL (ref 0–99)
Triglycerides: 67 mg/dL (ref 0–149)
VLDL Cholesterol Cal: 12 mg/dL (ref 5–40)

## 2025-01-10 ENCOUNTER — Ambulatory Visit: Admitting: Internal Medicine

## 2025-01-10 DIAGNOSIS — J449 Chronic obstructive pulmonary disease, unspecified: Secondary | ICD-10-CM

## 2025-01-10 LAB — BAYER DCA HB A1C WAIVED: HB A1C (BAYER DCA - WAIVED): 4.7 % — ABNORMAL LOW (ref 4.8–5.6)

## 2025-01-10 NOTE — Progress Notes (Unsigned)
 "   Denise Ford, female    DOB: May 03, 1961    MRN: [id number]   Brief patient profile:  76  yowf  active smoker  referred to pulmonary clinic in [place]  12/10/2023 by Gloria Zarwolo NP  for cough and doe x fall 2023.  Hoarse x her whole life with last [place] eval 03/2022 pos polyps    History of Present Illness  12/10/2023  Pulmonary/ 1st office eval/ Shamonique Battiste / [place] [facility] on prn saba  Chief Complaint  Patient presents with   Establish Care   Shortness of Breath   Dyspnea:  50 ft downhill to mb and out of breath when gets  to  house  Cough: white in am / 30 min  Sleep: flat bed one pillow/ once   month  SABA use: avg twice a day / after ex  02: none  Lung cancer screen: per PCP Rec My office will be contacting you by phone for referral to [facility] > not seen as of 07/14/2024  - if you don't hear back from my office within one week please call us  back or notify us  thru MyChart and we'll address it right away.  Plan A = Automatic = Always=   Symbicort  80 Take 2 puffs first thing in am and then another 2 puffs about 12 hours later.  Work on inhaler technique:  >>>  Remember how golfers warm up by taking practice swings - do this with an empty inhaler  Plan B = Backup (to supplement plan A, not to replace it) Only use your albuterol  inhaler as a rescue medication The key is to stop smoking completely before smoking completely stops you!    PFT's 03/10/24  FEV1 1.32 (52 % ) ratio 0.62 p 0 % improvement from saba p symbicort  prior to study with DLCO 16 (80%) and FV curve atypical exp> insp straightening but no plateau and ERV 18 at wt 144   07/14/2024  f/u ov/[place] office/Zoella Roberti re: GOLD 2 copd  maint on symbicort  80  did  bring albuterol  inhalers and still smoking  Chief Complaint  Patient presents with   Follow-up    Breathing is some better since the last visit. She would like referral to ENT in [place] regarding vocal cord polyps. She is using her albuterol  inhaler  about 5 x per wk on average.    Dyspnea:  mb to landing and stops to catch  breathbut easier on symbicort  despit suboptimal hfa (see below)  Cough: minimal white mucus smokers rattle  Sleeping: flat bed one pillow s    resp cc  SABA use: rarely hfa  02: none  Lung cancer screening:scheduled  for 08/25/24   LDSCT  08/25/24 RADS2  Centrilobular and paraseptal emphysema / atherosclerosis    01/10/2025  f/u ov/ office/Hollye Pritt re: GOLD 2 copd  maint on ***  No chief complaint on file.   Dyspnea:  *** Cough: *** Sleeping: ***   resp cc  SABA use: *** 02: ***  Lung cancer screening: ***   No obvious day to day or daytime variability or assoc excess/ purulent sputum or mucus plugs or hemoptysis or cp or chest tightness, subjective wheeze or overt sinus or hb symptoms.    Also denies any obvious fluctuation of symptoms with weather or environmental changes or other aggravating or alleviating factors except as outlined above   No unusual exposure hx or h/o childhood pna/ asthma or knowledge of premature birth.  Current Allergies, Complete Past Medical  History, Past Surgical History, Family History, and Social History were reviewed in [employer] record.  ROS  The following are not active complaints unless bolded Hoarseness, sore throat, dysphagia, dental problems, itching, sneezing,  nasal congestion or discharge of excess mucus or purulent secretions, ear ache,   fever, chills, sweats, unintended wt loss or wt gain, classically pleuritic or exertional cp,  orthopnea pnd or arm/hand swelling  or leg swelling, presyncope, palpitations, abdominal pain, anorexia, nausea, vomiting, diarrhea  or change in bowel habits or change in bladder habits, change in stools or change in urine, dysuria, hematuria,  rash, arthralgias, visual complaints, headache, numbness, weakness or ataxia or problems with walking or coordination,  change in mood or  memory.         Outpatient  Medications Prior to Visit  Medication Sig Dispense Refill   ALPRAZolam  (XANAX ) 0.5 MG tablet Take 1 tablet (0.5 mg total) by mouth at bedtime as needed for sleep. 20 tablet 1   atorvastatin  (LIPITOR) 20 MG tablet Take 1 tablet (20 mg total) by mouth daily. 90 tablet 1   cyanocobalamin (VITAMIN B12) 1000 MCG tablet Take 1 tablet (1,000 mcg total) by mouth daily. 90 tablet 0   nicotine  (NICODERM CQ ) 14 mg/24hr patch Place 1 patch (14 mg total) onto the skin daily. 28 patch 2   Omega-3 Fatty Acids (FISH OIL PO) Take 1 tablet by mouth daily.     omeprazole  (PRILOSEC) 40 MG capsule Take 1 capsule (40 mg total) by mouth daily. 180 capsule 1   SYMBICORT  80-4.5 MCG/ACT inhaler INHALE 2 PUFFS BY MOUTH FIRST THING IN THE MORNING AND THEN ANOTHER 2 PUFFS ABOUT 12 HOURS LATER 11 g 5   VENTOLIN  HFA 108 (90 Base) MCG/ACT inhaler INHALE 2 PUFFS BY MOUTH EVERY 6 HOURS AS NEEDED FOR  COUGHING,  WHEEZING,  AND  SHORTNESS  OF  BREATH 54 g 0   Vitamin D , Ergocalciferol , (DRISDOL ) 1.25 MG (50000 UNIT) CAPS capsule Take 1 capsule (50,000 Units total) by mouth every 7 (seven) days. 20 capsule 1   No facility-administered medications prior to visit.         Past Medical History:  Diagnosis Date   Anxiety    Hyperlipidemia    Vocal cord polyps       Objective:    wts   01/10/2025         ***   07/14/2024       129   06/23/24 129 lb 12.8 oz (58.9 kg)  02/22/24 144 lb 1.3 oz (65.4 kg)  12/10/23 148 lb (67.1 kg)     Vital signs reviewed  01/10/2025  - Note at rest 02 sats  ***% on ***   General appearance:    ***    Min bar***         Assessment           "

## 2025-01-16 ENCOUNTER — Encounter: Payer: Self-pay | Admitting: Internal Medicine

## 2025-01-16 ENCOUNTER — Ambulatory Visit: Admitting: Internal Medicine

## 2025-01-16 VITALS — BP 118/79 | HR 86 | Ht 64.0 in | Wt 123.0 lb

## 2025-01-16 DIAGNOSIS — R490 Dysphonia: Secondary | ICD-10-CM | POA: Diagnosis not present

## 2025-01-16 DIAGNOSIS — J439 Emphysema, unspecified: Secondary | ICD-10-CM

## 2025-01-16 DIAGNOSIS — J449 Chronic obstructive pulmonary disease, unspecified: Secondary | ICD-10-CM

## 2025-01-16 DIAGNOSIS — F17210 Nicotine dependence, cigarettes, uncomplicated: Secondary | ICD-10-CM

## 2025-01-16 NOTE — Assessment & Plan Note (Addendum)
 Active smoker -  LDSCT  08/25/23  Mild diffuse bronchial wall thickening with mild centrilobular and paraseptal emphysema. - 12/10/2023  After extensive coaching inhaler device,  effectiveness =  near 0 try symbicort  80 1-2 bid - 12/10/2023   Walked on RA  x  3  lap(s) =  approx 450  ft  @ mod pace, stopped due to end of study s sob  with lowest 02 sats 91% - PFT's  03/10/24  FEV1 1.32 (52 % ) ratio 0.62  p 0 % improvement from saba p symbicort   prior to study with DLCO  16 (80%)   and FV curve atypical exp> insp  and ERV 18 at wt 144     - 07/14/2024  After extensive coaching inhaler device,  effectiveness =    75% (short ti) from a baseline of 50% > continue low dose ICS due to hoarseness, consider adding spiriva next   - 01/16/2025  After extensive coaching inhaler device,  effectiveness =    80%  >>> continue symbicort  80 with approp saba with option to add spiriva if having aecopd or limiting doe  Re SABA :  I spent extra time with pt today reviewing appropriate use of albuterol  for prn use on exertion with the following points: 1) saba is for relief of sob that does not improve by walking a slower pace or resting but rather if the pt does not improve after trying this first. 2) If the pt is convinced, as many are, that saba helps recover from activity faster then it's easy to tell if this is the case by re-challenging : ie stop, take the inhaler, then p 5 minutes try the exact same activity (intensity of workload) that just caused the symptoms and see if they are substantially diminished or not after saba 3) if there is an activity that reproducibly causes the symptoms, try the saba 15 min before the activity on alternate days   If in fact the saba really does help, then fine to continue to use it prn but advised may need to look closer at the maintenance regimen (only symbicort  80 for now due to concerns with upper airway instability from [facility] polyps/gerd)  being used to achieve better control of  airways disease with exertion.

## 2025-01-16 NOTE — Progress Notes (Signed)
 "   Denise Ford, female    DOB: 03/07/1961    MRN: [id number]   Brief patient profile:  22  yowf  active smoker  referred to pulmonary clinic in [place]  12/10/2023 by Gloria Zarwolo NP  for cough and doe x fall 2023.  Hoarse x her whole life with last [facility] eval 03/2022 pos polyps  and gerd and rec tight control of GERD before considerining polyp surgery   History of Present Illness  12/10/2023  Pulmonary/ 1st office eval/ Harjas Biggins / [place] [facility] on prn saba  Chief Complaint  Patient presents with   Establish Care   Shortness of Breath   Dyspnea:  50 ft downhill to mb and out of breath when gets  to  house  Cough: white in am / 30 min  Sleep: flat bed one pillow/ once   month  SABA use: avg twice a day / after ex  02: none  Lung cancer screen: per PCP Rec My office will be contacting you by phone for referral to [facility] > not seen as of 07/14/2024  - if you don't hear back from my office within one week please call us  back or notify us  thru MyChart and we'll address it right away.  Plan A = Automatic = Always=   Symbicort  80 Take 2 puffs first thing in am and then another 2 puffs about 12 hours later.  Work on inhaler technique:  >>>  Remember how golfers warm up by taking practice swings - do this with an empty inhaler  Plan B = Backup (to supplement plan A, not to replace it) Only use your albuterol  inhaler as a rescue medication The key is to stop smoking completely before smoking completely stops you!    PFT's 03/10/24  FEV1 1.32 (52 % ) ratio 0.62 p 0 % improvement from saba p symbicort  prior to study with DLCO 16 (80%) and FV curve atypical exp> insp straightening but no plateau and ERV 18 at wt 144   07/14/2024  f/u ov/[place] office/Sayler Mickiewicz re: GOLD 2 copd  maint on symbicort  80  did  bring albuterol  inhalers and still smoking  Chief Complaint  Patient presents with   Follow-up    Breathing is some better since the last visit. She would like referral to ENT in  [place] regarding vocal cord polyps. She is using her albuterol  inhaler about 5 x per wk on average.    Dyspnea:  mb to landing and stops to catch  breathbut easier on symbicort  despit suboptimal hfa (see below)  Cough: minimal white mucus smokers rattle  Sleeping: flat bed one pillow s    resp cc  SABA use: rarely hfa  02: none   Referred to ENT   LDSCT  08/25/24 RADS2  Centrilobular and paraseptal emphysema / atherosclerosis    01/16/2025  f/u ov/[place] office/Destaney Sarkis re: GOLD 2 copd  maint on symbicort  80 2bid  still smoking  Chief Complaint  Patient presents with   COPD    Coughing white / min shob   Dyspnea:  able to climb up the hill to her house s stopping until gets to top x sev hundred feet/ walks to store maybe a quarter mild flat  Cough: raspy/throaty > white sometimes worse in am  Sleeping: flat bed 2 pillows  s noct    resp cc  SABA use: every other day always p activity  02: none   Lung cancer screening: q August    No  obvious day to day or daytime variability or assoc  purulent sputum or mucus plugs or hemoptysis or cp or chest tightness, subjective wheeze or overt hb symptoms.    Also denies any obvious fluctuation of symptoms with weather or environmental changes or other aggravating or alleviating factors except as outlined above   No unusual exposure hx or h/o childhood pna/ asthma or knowledge of premature birth.  Current Allergies, Complete Past Medical History, Past Surgical History, Family History, and Social History were reviewed in [employer] record.  ROS  The following are not active complaints unless bolded Hoarseness, sore throat, dysphagia, dental problems, itching, sneezing,  nasal congestion or discharge of excess mucus or purulent secretions, ear ache,   fever, chills, sweats, unintended wt loss or wt gain, classically pleuritic or exertional cp,  orthopnea pnd or arm/hand swelling  or leg swelling, presyncope, palpitations,  abdominal pain, anorexia, nausea, vomiting, diarrhea  or change in bowel habits or change in bladder habits, change in stools or change in urine, dysuria, hematuria,  rash, arthralgias, visual complaints, headache, numbness, weakness or ataxia or problems with walking or coordination,  change in mood or  memory.         Outpatient Medications Prior to Visit  Medication Sig Dispense Refill   ALPRAZolam  (XANAX ) 0.5 MG tablet Take 1 tablet (0.5 mg total) by mouth at bedtime as needed for sleep. 20 tablet 1   atorvastatin  (LIPITOR) 20 MG tablet Take 1 tablet (20 mg total) by mouth daily. 90 tablet 1   cyanocobalamin (VITAMIN B12) 1000 MCG tablet Take 1 tablet (1,000 mcg total) by mouth daily. 90 tablet 0   nicotine  (NICODERM CQ ) 14 mg/24hr patch Place 1 patch (14 mg total) onto the skin daily. 28 patch 2   Omega-3 Fatty Acids (FISH OIL PO) Take 1 tablet by mouth daily.     omeprazole  (PRILOSEC) 40 MG capsule Take 1 capsule (40 mg total) by mouth daily. 180 capsule 1   SYMBICORT  80-4.5 MCG/ACT inhaler INHALE 2 PUFFS BY MOUTH FIRST THING IN THE MORNING AND THEN ANOTHER 2 PUFFS ABOUT 12 HOURS LATER 11 g 5   VENTOLIN  HFA 108 (90 Base) MCG/ACT inhaler INHALE 2 PUFFS BY MOUTH EVERY 6 HOURS AS NEEDED FOR  COUGHING,  WHEEZING,  AND  SHORTNESS  OF  BREATH 54 g 0   Vitamin D , Ergocalciferol , (DRISDOL ) 1.25 MG (50000 UNIT) CAPS capsule Take 1 capsule (50,000 Units total) by mouth every 7 (seven) days. 20 capsule 1   No facility-administered medications prior to visit.         Past Medical History:  Diagnosis Date   Anxiety    Hyperlipidemia    Vocal cord polyps       Objective:    wts   01/16/2025         123   07/14/2024       129   06/23/24 129 lb 12.8 oz (58.9 kg)  02/22/24 144 lb 1.3 oz (65.4 kg)  12/10/23 148 lb (67.1 kg)     Vital signs reviewed  01/16/2025  - Note at rest 02 sats  99% on RA   General appearance:    amb hoarse wf nad / mild pw    HEENT : Oropharynx  clear   Nasal  turbinates nl    NECK :  without  apparent JVD/ palpable Nodes/TM    LUNGS: no acc muscle use,  Min barrel  contour chest wall with bilateral  slightly decreased  bs s audible wheeze and  without cough on insp or exp maneuvers and min  Hyperresonant  to  percussion bilaterally    CV:  RRR  no s3 or murmur or increase in P2, and no edema   ABD:  soft and nontender    MS:  Nl gait/ ext warm without deformities Or obvious joint restrictions  calf tenderness, cyanosis or clubbing     SKIN: warm and dry without lesions    NEURO:  alert, approp, nl sensorium with  no motor or cerebellar deficits apparent.               Assessment   Assessment & Plan COPD GOLD / mild emphysema on LDSCT Active smoker -  LDSCT  08/25/23  Mild diffuse bronchial wall thickening with mild centrilobular and paraseptal emphysema. - 12/10/2023  After extensive coaching inhaler device,  effectiveness =  near 0 try symbicort  80 1-2 bid - 12/10/2023   Walked on RA  x  3  lap(s) =  approx 450  ft  @ mod pace, stopped due to end of study s sob  with lowest 02 sats 91% - PFT's  03/10/24  FEV1 1.32 (52 % ) ratio 0.62  p 0 % improvement from saba p symbicort   prior to study with DLCO  16 (80%)   and FV curve atypical exp> insp  and ERV 18 at wt 144     - 07/14/2024  After extensive coaching inhaler device,  effectiveness =    75% (short ti) from a baseline of 50% > continue low dose ICS due to hoarseness, consider adding spiriva next   - 01/16/2025  After extensive coaching inhaler device,  effectiveness =    80%  >>> continue symbicort  80 with approp saba with option to add spiriva if having aecopd or limiting doe  Re SABA :  I spent extra time with pt today reviewing appropriate use of albuterol  for prn use on exertion with the following points: 1) saba is for relief of sob that does not improve by walking a slower pace or resting but rather if the pt does not improve after trying this first. 2) If the pt is convinced, as  many are, that saba helps recover from activity faster then it's easy to tell if this is the case by re-challenging : ie stop, take the inhaler, then p 5 minutes try the exact same activity (intensity of workload) that just caused the symptoms and see if they are substantially diminished or not after saba 3) if there is an activity that reproducibly causes the symptoms, try the saba 15 min before the activity on alternate days   If in fact the saba really does help, then fine to continue to use it prn but advised may need to look closer at the maintenance regimen (only symbicort  80 for now due to concerns with upper airway instability from [facility] polyps/gerd)  being used to achieve better control of airways disease with exertion.   Hoarseness Hoarse x her whole life with last [facility] eval 03/2022 pos polyps  and gerd and rec tight control of GERD before considerining polyp surgery -  Otolaryngology referral > [place] ent / continue gerd rx 01/16/2025 >>>    Cigarette smoker Counseled re importance of smoking cessation but did not meet time criteria for separate billing    >>> keep up with LDSCT q August, return for ov q September          Each maintenance  medication was reviewed in detail including emphasizing most importantly the difference between maintenance and prns and under what circumstances the prns are to be triggered using an action plan format where appropriate.  Total time for H and P, chart review, counseling, reviewing hfa device(s) and generating customized AVS unique to this office visit / same day charting = 35 min          AVS  Patient Instructions  My office will be contacting you by phone for referral to [facility]  - if you don't hear back from my office within one week please call us  back or notify us  thru MyChart and we'll address it right away.   Work on inhaler technique:  relax and gently blow all the way out then take a nice smooth full deep breath back in, triggering the  inhaler at same time you start breathing in.  Hold breath in for at least  5 seconds if you can. Blow out symbicort  thru nose. Rinse and gargle with water when done.  If mouth or throat bother you at all,  try brushing teeth/gums/tongue with arm and hammer toothpaste/ make a slurry and gargle and spit out.   The key is to stop smoking completely before smoking completely stops you!  Please schedule a follow up visit in 9 months but call sooner if needed          Ozell America, MD 01/16/2025          "

## 2025-01-16 NOTE — Assessment & Plan Note (Addendum)
 Counseled re importance of smoking cessation but did not meet time criteria for separate billing    >>> keep up with LDSCT q August, return for ov q September          Each maintenance medication was reviewed in detail including emphasizing most importantly the difference between maintenance and prns and under what circumstances the prns are to be triggered using an action plan format where appropriate.  Total time for H and P, chart review, counseling, reviewing hfa device(s) and generating customized AVS unique to this office visit / same day charting = 35 min

## 2025-01-16 NOTE — Assessment & Plan Note (Addendum)
 Hoarse x her whole life with last [facility] eval 03/2022 pos polyps  and gerd and rec tight control of GERD before considerining polyp surgery -  Otolaryngology referral > [place] [facility] / continue gerd rx 01/16/2025 >>>

## 2025-01-16 NOTE — Patient Instructions (Addendum)
 My office will be contacting you by phone for referral to [facility]  - if you don't hear back from my office within one week please call us  back or notify us  thru MyChart and we'll address it right away.   Work on inhaler technique:  relax and gently blow all the way out then take a nice smooth full deep breath back in, triggering the inhaler at same time you start breathing in.  Hold breath in for at least  5 seconds if you can. Blow out symbicort  thru nose. Rinse and gargle with water when done.  If mouth or throat bother you at all,  try brushing teeth/gums/tongue with arm and hammer toothpaste/ make a slurry and gargle and spit out.   The key is to stop smoking completely before smoking completely stops you!  Please schedule a follow up visit in 9 months but call sooner if needed

## 2025-07-05 ENCOUNTER — Ambulatory Visit: Payer: Self-pay | Admitting: Nurse Practitioner

## 2025-12-06 ENCOUNTER — Ambulatory Visit: Admitting: "Endocrinology
